# Patient Record
Sex: Male | Born: 1955 | Race: Black or African American | Hispanic: No | Marital: Married | Smoking: Never smoker
Health system: Southern US, Community
[De-identification: ages and names within clinical notes are randomized; demographics above are authoritative.]

## PROBLEM LIST (undated history)

## (undated) DIAGNOSIS — R739 Hyperglycemia, unspecified: Secondary | ICD-10-CM

## (undated) HISTORY — DX: Hyperglycemia, unspecified: R73.9

## (undated) HISTORY — PX: COLONOSCOPY: SHX174

---

## 2003-07-18 ENCOUNTER — Encounter: Payer: Self-pay | Admitting: Family Medicine

## 2003-07-18 ENCOUNTER — Ambulatory Visit (HOSPITAL_COMMUNITY): Admission: RE | Admit: 2003-07-18 | Discharge: 2003-07-18 | Payer: Self-pay | Admitting: Family Medicine

## 2005-10-03 ENCOUNTER — Ambulatory Visit (HOSPITAL_COMMUNITY): Admission: RE | Admit: 2005-10-03 | Discharge: 2005-10-03 | Payer: Self-pay | Admitting: Family Medicine

## 2010-01-12 ENCOUNTER — Ambulatory Visit (HOSPITAL_COMMUNITY): Admission: RE | Admit: 2010-01-12 | Discharge: 2010-01-12 | Payer: Self-pay | Admitting: Family Medicine

## 2010-10-15 ENCOUNTER — Encounter: Payer: Self-pay | Admitting: Internal Medicine

## 2010-10-22 ENCOUNTER — Ambulatory Visit: Payer: Self-pay | Admitting: Internal Medicine

## 2010-10-22 ENCOUNTER — Ambulatory Visit (HOSPITAL_COMMUNITY)
Admission: RE | Admit: 2010-10-22 | Discharge: 2010-10-22 | Payer: Self-pay | Source: Home / Self Care | Admitting: Internal Medicine

## 2010-10-28 ENCOUNTER — Encounter: Payer: Self-pay | Admitting: Internal Medicine

## 2011-01-01 NOTE — Letter (Signed)
Summary: Nathan Reynolds,Nathan Reynolds  Nathan Reynolds,Nathan Reynolds   Imported By: Rexene Alberts 10/15/2010 16:15:48  _____________________________________________________________________  External Attachment:    Type:   Image     Comment:   External Document

## 2011-01-01 NOTE — Letter (Signed)
Summary: Patient Notice, Colon Biopsy Results  [facility]  [address]   [place], [place] [address]   Phone: [phone]  Fax: [phone]       October 28, 2010   ATWOOD ADCOCK [address] [place], [place]  [address] 01/08/1956    Dear Mr. Forsberg,  I am pleased to inform you that the biopsies taken during your recent colonoscopy did not show any evidence of cancer upon pathologic examination.  Additional information/recommendations:.  You should have a repeat colonoscopy examination  in 7 years.  Please call us if you are having persistent problems or have questions about your condition that have not been fully answered at this time.  Sincerely,    R. Roetta Sessions MD, FACP [facility] Ph: [phone]    Fax: [phone]   Appended Document: Patient Notice, Colon Biopsy Results Letter mailed to pt.  Appended Document: Patient Notice, Colon Biopsy Results reminder in computer

## 2011-06-18 NOTE — Op Note (Signed)
[name], [name]                ACCOUNT NO.:  [id number]  MEDICAL RECORD NO.:  [id number]  LOCATION:  DAY                           FACILITY:  [facility]  PHYSICIAN:  R. Roetta Sessions, MD FACP FACGDATE OF BIRTH:  Apr 21, 1956  DATE OF PROCEDURE:  10/22/2010 DATE OF DISCHARGE:  10/22/2010                              OPERATIVE REPORT   INDICATIONS FOR PROCEDURE:  A 55 year old gentleman with no lower GI tract symptoms, sent over at the courtesy Dr. Lilyan Punt, for first ever screening colonoscopy.  No family history of polyps or colon cancer. Colonoscopy is now being done as screening maneuver.  Risks, benefits, limitations, alternatives, imponderables have been reviewed, questions answered, all parties agreeable to proceed.  Please see documentation in the medical record.  PROCEDURE NOTE:  O2 saturation, blood pressure, pulse, respirations were monitored throughout the entire procedure.  CONSCIOUS SEDATION:  Versed 5 mg IV, Demerol 100 mg IV in divided doses.  INSTRUMENT:  Pentax video chip system.  FINDINGS:  Digital rectal exam revealed no abnormalities.  Endoscopic findings:  Prep was adequate.  Colon:  Colonic mucosa was surveyed from the rectosigmoid junction through the left transverse right colon to the appendiceal orifice, ileocecal valve/cecum.  These structures were well seen and photographed for the record.  From this level, the scope was slowly and cautiously withdrawn.  All previously mentioned mucosal surfaces were again seen.  On the withdrawal, there was a greasy material in the colonic effluent which obscured the lens and the visual field.  The time I got to the left colon, it was pretty bad and could not wash it off, and had the patient to withdrawal of the scope.  I cleaned the scope off and advanced to the level where I left off.  I did take a mucosal biopsy, not for the pathologist, but for a landmark as to where I pulled back at.  I went up to this level and  continued to pull back, carefully examining all the mucosal surfaces.  At the splenic flexure, there was a single diminutive polyp which was cold biopsied/removed in the mid sigmoid and there was another sigmoid polyp which was cold biopsied/ removed.  Remainder of colonic mucosa appeared normal.  Scope was pulled down into the rectum where thorough examination of the rectal mucosa including retroflex view of the anal verge demonstrated no abnormalities.  The patient tolerated the procedure well.  Cecal withdrawal time 15 minutes.  IMPRESSION:  Polyps at the splenic flexure and sigmoid segment status post cold biopsy removal.  Remainder of colonic mucosa and rectum appeared normal.  RECOMMENDATIONS: 1. Polyp literature provided to [name]. 2. Followup on path. 3. Further recommendations to follow soon.     Nathan Bellows, MD FACP [facility]     RMR/MEDQ  D:  10/22/2010  T:  10/23/2010  Job:  [id number]  cc:   Lorin Picket A. Gerda Diss, MD Fax: [phone]

## 2013-02-01 ENCOUNTER — Encounter: Payer: Self-pay | Admitting: *Deleted

## 2013-02-02 ENCOUNTER — Encounter: Payer: Self-pay | Admitting: *Deleted

## 2013-07-23 ENCOUNTER — Telehealth: Payer: Self-pay | Admitting: Family Medicine

## 2013-07-23 NOTE — Telephone Encounter (Signed)
Patient needs bloodwork papers including testosterone and please mail

## 2013-07-24 ENCOUNTER — Other Ambulatory Visit: Payer: Self-pay | Admitting: *Deleted

## 2013-07-26 NOTE — Telephone Encounter (Signed)
Note was on chart this am

## 2013-07-27 ENCOUNTER — Other Ambulatory Visit: Payer: Self-pay | Admitting: *Deleted

## 2013-07-27 DIAGNOSIS — F528 Other sexual dysfunction not due to a substance or known physiological condition: Secondary | ICD-10-CM

## 2013-07-27 DIAGNOSIS — Z0000 Encounter for general adult medical examination without abnormal findings: Secondary | ICD-10-CM

## 2013-07-27 DIAGNOSIS — Z125 Encounter for screening for malignant neoplasm of prostate: Secondary | ICD-10-CM

## 2013-07-27 DIAGNOSIS — R5381 Other malaise: Secondary | ICD-10-CM

## 2013-07-27 NOTE — Telephone Encounter (Signed)
Cbc, bmet, liver, lipid, psa, testosterone. bw papers mailed to pt.

## 2013-09-30 ENCOUNTER — Encounter: Payer: Self-pay | Admitting: Family Medicine

## 2013-10-12 LAB — CBC WITH DIFFERENTIAL/PLATELET
Basophils Absolute: 0 10*3/uL (ref 0.0–0.1)
Basophils Relative: 1 % (ref 0–1)
Eosinophils Absolute: 0.1 10*3/uL (ref 0.0–0.7)
Eosinophils Relative: 3 % (ref 0–5)
HCT: 39.4 % (ref 39.0–52.0)
Hemoglobin: 13.6 g/dL (ref 13.0–17.0)
Lymphocytes Relative: 40 % (ref 12–46)
Lymphs Abs: 1.8 10*3/uL (ref 0.7–4.0)
MCH: 30.7 pg (ref 26.0–34.0)
MCHC: 34.5 g/dL (ref 30.0–36.0)
MCV: 88.9 fL (ref 78.0–100.0)
Monocytes Absolute: 0.3 10*3/uL (ref 0.1–1.0)
Monocytes Relative: 7 % (ref 3–12)
Neutro Abs: 2.2 10*3/uL (ref 1.7–7.7)
Neutrophils Relative %: 49 % (ref 43–77)
Platelets: 156 10*3/uL (ref 150–400)
RBC: 4.43 MIL/uL (ref 4.22–5.81)
RDW: 12.9 % (ref 11.5–15.5)
WBC: 4.5 10*3/uL (ref 4.0–10.5)

## 2013-10-12 LAB — HEPATIC FUNCTION PANEL
ALT: 20 U/L (ref 0–53)
AST: 19 U/L (ref 0–37)
Albumin: 4 g/dL (ref 3.5–5.2)
Alkaline Phosphatase: 61 U/L (ref 39–117)
Bilirubin, Direct: 0.1 mg/dL (ref 0.0–0.3)
Indirect Bilirubin: 0.3 mg/dL (ref 0.0–0.9)
Total Bilirubin: 0.4 mg/dL (ref 0.3–1.2)
Total Protein: 6.5 g/dL (ref 6.0–8.3)

## 2013-10-12 LAB — LIPID PANEL
Cholesterol: 140 mg/dL (ref 0–200)
HDL: 53 mg/dL (ref >39)
LDL Cholesterol: 74 mg/dL (ref 0–99)
Total CHOL/HDL Ratio: 2.6 Ratio
Triglycerides: 63 mg/dL (ref <150)
VLDL: 13 mg/dL (ref 0–40)

## 2013-10-12 LAB — TESTOSTERONE: Testosterone: 259 ng/dL — ABNORMAL LOW (ref 300–890)

## 2013-10-12 LAB — BASIC METABOLIC PANEL
BUN: 12 mg/dL (ref 6–23)
CO2: 27 mEq/L (ref 19–32)
Calcium: 9 mg/dL (ref 8.4–10.5)
Chloride: 104 mEq/L (ref 96–112)
Creat: 0.85 mg/dL (ref 0.50–1.35)
Glucose, Bld: 80 mg/dL (ref 70–99)
Potassium: 3.9 mEq/L (ref 3.5–5.3)
Sodium: 138 mEq/L (ref 135–145)

## 2013-10-12 LAB — PSA: PSA: 1.12 ng/mL (ref <=4.00)

## 2013-10-15 ENCOUNTER — Ambulatory Visit (INDEPENDENT_AMBULATORY_CARE_PROVIDER_SITE_OTHER): Payer: Managed Care, Other (non HMO) | Admitting: Family Medicine

## 2013-10-15 ENCOUNTER — Encounter: Payer: Self-pay | Admitting: Family Medicine

## 2013-10-15 VITALS — BP 110/70 | Ht 67.5 in | Wt 176.4 lb

## 2013-10-15 DIAGNOSIS — Z0000 Encounter for general adult medical examination without abnormal findings: Secondary | ICD-10-CM

## 2013-10-15 NOTE — Progress Notes (Signed)
  Subjective:    Patient ID: Nathan Reynolds, male    DOB: May 10, 1956, 57 y.o.   MRN: [id number]  HPI Patient is here today for his annual wellness exam. Patient states he has no concerns at this time. Doing very well.  The patient comes in today for a wellness visit.  A review of their health history was completed.  A review of medications was also completed. Any necessary refills were discussed. Sensible healthy diet was discussed. Importance of minimizing excessive salt and carbohydrates was also discussed. Safety was stressed including driving, activities at work and at home where applicable. Importance of regular physical activity for overall health was discussed. Preventative measures appropriate for age were discussed. Time was spent with the patient discussing any concerns they have about their well-being.   Review of Systems  Constitutional: Negative for fever, activity change and appetite change.  HENT: Negative for congestion and rhinorrhea.   Eyes: Negative for discharge.  Respiratory: Negative for cough and wheezing.   Cardiovascular: Negative for chest pain.  Gastrointestinal: Negative for vomiting, abdominal pain and blood in stool.  Genitourinary: Negative for frequency and difficulty urinating.  Musculoskeletal: Negative for neck pain.  Skin: Negative for rash.  Allergic/Immunologic: Negative for environmental allergies and food allergies.  Neurological: Negative for weakness and headaches.  Psychiatric/Behavioral: Negative for agitation.       Objective:   Physical Exam  Constitutional: He appears well-developed and well-nourished.  HENT:  Head: Normocephalic and atraumatic.  Right Ear: External ear normal.  Left Ear: External ear normal.  Nose: Nose normal.  Mouth/Throat: Oropharynx is clear and moist.  Eyes: EOM are normal. Pupils are equal, round, and reactive to light.  Neck: Normal range of motion. Neck supple. No thyromegaly present.  Cardiovascular:  Normal rate, regular rhythm and normal heart sounds.   No murmur heard. Pulmonary/Chest: Effort normal and breath sounds normal. No respiratory distress. He has no wheezes.  Abdominal: Soft. Bowel sounds are normal. He exhibits no distension and no mass. There is no tenderness.  Genitourinary: Penis normal.  Musculoskeletal: Normal range of motion. He exhibits no edema.  Lymphadenopathy:    He has no cervical adenopathy.  Neurological: He is alert. He exhibits normal muscle tone.  Skin: Skin is warm and dry. No erythema.  Psychiatric: He has a normal mood and affect. His behavior is normal. Judgment normal.          Assessment & Plan:  Wellness discussed. Lab work overall looks very good. He needs to stay healthy and try to lose about 7-10 pounds exercise watching diet. No colonoscopy necessary today prostate exam was normal. Lab work looked good.

## 2013-11-05 ENCOUNTER — Encounter: Payer: Self-pay | Admitting: Family Medicine

## 2014-10-28 ENCOUNTER — Telehealth: Payer: Self-pay | Admitting: *Deleted

## 2014-10-28 DIAGNOSIS — R5383 Other fatigue: Secondary | ICD-10-CM

## 2014-10-28 DIAGNOSIS — Z139 Encounter for screening, unspecified: Secondary | ICD-10-CM

## 2014-10-28 DIAGNOSIS — Z125 Encounter for screening for malignant neoplasm of prostate: Secondary | ICD-10-CM

## 2014-10-28 DIAGNOSIS — Z13220 Encounter for screening for lipoid disorders: Secondary | ICD-10-CM

## 2014-10-28 NOTE — Telephone Encounter (Signed)
Requesting bloodwork for appt dec 4th for physical. Last bloodwork 10/11/13. Lipid, cbc,bmp, liver, psa, testosterone

## 2014-10-28 NOTE — Telephone Encounter (Signed)
May order the above labs

## 2014-10-28 NOTE — Telephone Encounter (Signed)
Orders ready. Pt notified on voicemail.  

## 2014-11-02 LAB — CBC WITH DIFFERENTIAL/PLATELET
Basophils Absolute: 0 10*3/uL (ref 0.0–0.1)
Basophils Relative: 1 % (ref 0–1)
EOS PCT: 2 % (ref 0–5)
Eosinophils Absolute: 0.1 10*3/uL (ref 0.0–0.7)
HCT: 40.5 % (ref 39.0–52.0)
Hemoglobin: 13.9 g/dL (ref 13.0–17.0)
Lymphocytes Relative: 40 % (ref 12–46)
Lymphs Abs: 1.9 10*3/uL (ref 0.7–4.0)
MCH: 30.5 pg (ref 26.0–34.0)
MCHC: 34.3 g/dL (ref 30.0–36.0)
MCV: 88.8 fL (ref 78.0–100.0)
MONOS PCT: 7 % (ref 3–12)
MPV: 10.4 fL (ref 9.4–12.4)
Monocytes Absolute: 0.3 10*3/uL (ref 0.1–1.0)
Neutro Abs: 2.4 10*3/uL (ref 1.7–7.7)
Neutrophils Relative %: 50 % (ref 43–77)
Platelets: 170 10*3/uL (ref 150–400)
RBC: 4.56 MIL/uL (ref 4.22–5.81)
RDW: 12.4 % (ref 11.5–15.5)
WBC: 4.7 10*3/uL (ref 4.0–10.5)

## 2014-11-02 LAB — BASIC METABOLIC PANEL
BUN: 17 mg/dL (ref 6–23)
CO2: 26 mEq/L (ref 19–32)
CREATININE: 1 mg/dL (ref 0.50–1.35)
Calcium: 9.7 mg/dL (ref 8.4–10.5)
Chloride: 106 mEq/L (ref 96–112)
Glucose, Bld: 81 mg/dL (ref 70–99)
Potassium: 4.3 mEq/L (ref 3.5–5.3)
Sodium: 140 mEq/L (ref 135–145)

## 2014-11-02 LAB — LIPID PANEL
Cholesterol: 184 mg/dL (ref 0–200)
HDL: 56 mg/dL (ref >39)
LDL Cholesterol: 119 mg/dL — ABNORMAL HIGH (ref 0–99)
Total CHOL/HDL Ratio: 3.3 Ratio
Triglycerides: 46 mg/dL (ref <150)
VLDL: 9 mg/dL (ref 0–40)

## 2014-11-02 LAB — HEPATIC FUNCTION PANEL
ALT: 24 U/L (ref 0–53)
AST: 22 U/L (ref 0–37)
Albumin: 4.3 g/dL (ref 3.5–5.2)
Alkaline Phosphatase: 69 U/L (ref 39–117)
Bilirubin, Direct: 0.1 mg/dL (ref 0.0–0.3)
Indirect Bilirubin: 0.6 mg/dL (ref 0.2–1.2)
TOTAL PROTEIN: 7.1 g/dL (ref 6.0–8.3)
Total Bilirubin: 0.7 mg/dL (ref 0.2–1.2)

## 2014-11-02 LAB — PSA: PSA: 1.47 ng/mL (ref <=4.00)

## 2014-11-02 LAB — TESTOSTERONE: Testosterone: 248 ng/dL — ABNORMAL LOW (ref 300–890)

## 2014-11-04 ENCOUNTER — Encounter: Payer: Self-pay | Admitting: Family Medicine

## 2014-11-04 ENCOUNTER — Ambulatory Visit (INDEPENDENT_AMBULATORY_CARE_PROVIDER_SITE_OTHER): Payer: Managed Care, Other (non HMO) | Admitting: Family Medicine

## 2014-11-04 VITALS — Ht 68.0 in | Wt 180.0 lb

## 2014-11-04 DIAGNOSIS — Z0000 Encounter for general adult medical examination without abnormal findings: Secondary | ICD-10-CM

## 2014-11-04 MED ORDER — IBUPROFEN 800 MG PO TABS: 800.0000 mg | ORAL_TABLET | Freq: Three times a day (TID) | ORAL | Status: DC | PRN

## 2014-11-04 NOTE — Progress Notes (Signed)
   Subjective:    Patient ID: Nathan Reynolds, male    DOB: Sep 22, 1956, 58 y.o.   MRN: [id number]  HPI The patient comes in today for a wellness visit.    A review of their health history was completed.  A review of medications was also completed.  Any needed refills; none  Eating habits: health conscious  Falls/  MVA accidents in past few months: none Regular exercise: none  Specialist pt sees on regular basis: none  Preventative health issues were discussed.   Additional concerns:  Right side neck pain.   Declines flu vaccine.   Review of Systems  Constitutional: Negative for fever, activity change and appetite change.  HENT: Negative for congestion and rhinorrhea.   Eyes: Negative for discharge.  Respiratory: Negative for cough and wheezing.   Cardiovascular: Negative for chest pain.  Gastrointestinal: Negative for vomiting, abdominal pain and blood in stool.  Genitourinary: Negative for frequency and difficulty urinating.  Musculoskeletal: Negative for neck pain.  Skin: Negative for rash.  Allergic/Immunologic: Negative for environmental allergies and food allergies.  Neurological: Negative for weakness and headaches.  Psychiatric/Behavioral: Negative for agitation.       Objective:   Physical Exam  Constitutional: He appears well-developed and well-nourished.  HENT:  Head: Normocephalic and atraumatic.  Right Ear: External ear normal.  Left Ear: External ear normal.  Nose: Nose normal.  Mouth/Throat: Oropharynx is clear and moist.  Eyes: EOM are normal. Pupils are equal, round, and reactive to light.  Neck: Normal range of motion. Neck supple. No thyromegaly present.  Cardiovascular: Normal rate, regular rhythm and normal heart sounds.   No murmur heard. Pulmonary/Chest: Effort normal and breath sounds normal. No respiratory distress. He has no wheezes.  Abdominal: Soft. Bowel sounds are normal. He exhibits no distension and no mass. There is no tenderness.    Genitourinary: Rectum normal, prostate normal and penis normal.  Musculoskeletal: Normal range of motion. He exhibits no edema.  Lymphadenopathy:    He has no cervical adenopathy.  Neurological: He is alert. He exhibits normal muscle tone.  Skin: Skin is warm and dry. No erythema.  Psychiatric: He has a normal mood and affect. His behavior is normal. Judgment normal.          Assessment & Plan:  Wellness covered motrinm for joints prn utd colon Recheck 1 year

## 2015-10-30 ENCOUNTER — Telehealth: Payer: Self-pay | Admitting: Family Medicine

## 2015-10-30 DIAGNOSIS — R5383 Other fatigue: Secondary | ICD-10-CM

## 2015-10-30 DIAGNOSIS — Z125 Encounter for screening for malignant neoplasm of prostate: Secondary | ICD-10-CM

## 2015-10-30 DIAGNOSIS — Z13220 Encounter for screening for lipoid disorders: Secondary | ICD-10-CM

## 2015-10-30 NOTE — Telephone Encounter (Signed)
Patient has wellness coming up on 12/9 and needing labs done.

## 2015-10-30 NOTE — Telephone Encounter (Signed)
May repeat the same tests

## 2015-10-30 NOTE — Telephone Encounter (Signed)
bw orders put in. Pt notified on vm.

## 2015-10-30 NOTE — Telephone Encounter (Signed)
11-01-14 lipid, liver, bmp, cbc, psa and testosterone

## 2015-11-04 LAB — CBC WITH DIFFERENTIAL/PLATELET
Basophils Absolute: 0 10*3/uL (ref 0.0–0.2)
Basos: 0 %
EOS (ABSOLUTE): 0.1 10*3/uL (ref 0.0–0.4)
Eos: 1 %
Hematocrit: 40.1 % (ref 37.5–51.0)
Hemoglobin: 13.7 g/dL (ref 12.6–17.7)
Immature Grans (Abs): 0 10*3/uL (ref 0.0–0.1)
Immature Granulocytes: 0 %
Lymphocytes Absolute: 2.1 10*3/uL (ref 0.7–3.1)
Lymphs: 42 %
MCH: 31.3 pg (ref 26.6–33.0)
MCHC: 34.2 g/dL (ref 31.5–35.7)
MCV: 92 fL (ref 79–97)
MONOS ABS: 0.3 10*3/uL (ref 0.1–0.9)
Monocytes: 6 %
Neutrophils Absolute: 2.4 10*3/uL (ref 1.4–7.0)
Neutrophils: 51 %
PLATELETS: 158 10*3/uL (ref 150–379)
RBC: 4.38 x10E6/uL (ref 4.14–5.80)
RDW: 12.5 % (ref 12.3–15.4)
WBC: 4.8 10*3/uL (ref 3.4–10.8)

## 2015-11-04 LAB — BASIC METABOLIC PANEL
BUN/Creatinine Ratio: 16 (ref 9–20)
BUN: 15 mg/dL (ref 6–24)
CO2: 20 mmol/L (ref 18–29)
Calcium: 9.2 mg/dL (ref 8.7–10.2)
Chloride: 104 mmol/L (ref 97–106)
Creatinine, Ser: 0.93 mg/dL (ref 0.76–1.27)
GFR calc non Af Amer: 90 mL/min/{1.73_m2} (ref >59)
GFR, EST AFRICAN AMERICAN: 104 mL/min/{1.73_m2} (ref >59)
GLUCOSE: 86 mg/dL (ref 65–99)
POTASSIUM: 4.1 mmol/L (ref 3.5–5.2)
Sodium: 144 mmol/L (ref 136–144)

## 2015-11-04 LAB — HEPATIC FUNCTION PANEL
ALT: 24 IU/L (ref 0–44)
AST: 22 IU/L (ref 0–40)
Albumin: 4.4 g/dL (ref 3.5–5.5)
Alkaline Phosphatase: 83 IU/L (ref 39–117)
Bilirubin Total: 0.6 mg/dL (ref 0.0–1.2)
Bilirubin, Direct: 0.18 mg/dL (ref 0.00–0.40)
Total Protein: 7 g/dL (ref 6.0–8.5)

## 2015-11-04 LAB — TESTOSTERONE: Testosterone: 225 ng/dL — ABNORMAL LOW (ref 348–1197)

## 2015-11-04 LAB — LIPID PANEL
CHOL/HDL RATIO: 2.8 ratio (ref 0.0–5.0)
Cholesterol, Total: 176 mg/dL (ref 100–199)
HDL: 63 mg/dL (ref >39)
LDL Calculated: 103 mg/dL — ABNORMAL HIGH (ref 0–99)
Triglycerides: 48 mg/dL (ref 0–149)
VLDL Cholesterol Cal: 10 mg/dL (ref 5–40)

## 2015-11-04 LAB — PSA: PROSTATE SPECIFIC AG, SERUM: 1.3 ng/mL (ref 0.0–4.0)

## 2015-11-10 ENCOUNTER — Encounter: Payer: Self-pay | Admitting: Family Medicine

## 2015-11-10 ENCOUNTER — Ambulatory Visit (INDEPENDENT_AMBULATORY_CARE_PROVIDER_SITE_OTHER): Payer: BLUE CROSS/BLUE SHIELD | Admitting: Family Medicine

## 2015-11-10 VITALS — BP 118/70 | Ht 67.5 in | Wt 185.0 lb

## 2015-11-10 DIAGNOSIS — Z0000 Encounter for general adult medical examination without abnormal findings: Secondary | ICD-10-CM | POA: Diagnosis not present

## 2015-11-10 NOTE — Progress Notes (Signed)
   Subjective:    Patient ID: Nathan Reynolds, male    DOB: 1956/01/22, 59 y.o.   MRN: [id number]  HPI The patient comes in today for a wellness visit.    A review of their health history was completed.  A review of medications was also completed.  Any needed refills; none  Eating habits: health conscious  Falls/  MVA accidents in past few months: none  Regular exercise: yes walking, treadmill  Specialist pt sees on regular basis: none  Preventative health issues were discussed.   Additional concerns: none  Declines flu vaccine.     Review of Systems  Constitutional: Negative for fever, activity change and appetite change.  HENT: Negative for congestion and rhinorrhea.   Eyes: Negative for discharge.  Respiratory: Negative for cough and wheezing.   Cardiovascular: Negative for chest pain.  Gastrointestinal: Negative for vomiting, abdominal pain and blood in stool.  Genitourinary: Negative for frequency and difficulty urinating.  Musculoskeletal: Negative for neck pain.  Skin: Negative for rash.  Allergic/Immunologic: Negative for environmental allergies and food allergies.  Neurological: Negative for weakness and headaches.  Psychiatric/Behavioral: Negative for agitation.       Objective:   Physical Exam  Constitutional: He appears well-developed and well-nourished.  HENT:  Head: Normocephalic and atraumatic.  Right Ear: External ear normal.  Left Ear: External ear normal.  Nose: Nose normal.  Mouth/Throat: Oropharynx is clear and moist.  Eyes: EOM are normal. Pupils are equal, round, and reactive to light.  Neck: Normal range of motion. Neck supple. No thyromegaly present.  Cardiovascular: Normal rate, regular rhythm and normal heart sounds.   No murmur heard. Pulmonary/Chest: Effort normal and breath sounds normal. No respiratory distress. He has no wheezes.  Abdominal: Soft. Bowel sounds are normal. He exhibits no distension and no mass. There is no  tenderness.  Genitourinary: Prostate normal and penis normal.  Musculoskeletal: Normal range of motion. He exhibits no edema.  Lymphadenopathy:    He has no cervical adenopathy.  Neurological: He is alert. He exhibits normal muscle tone.  Skin: Skin is warm and dry. No erythema.  Psychiatric: He has a normal mood and affect. His behavior is normal. Judgment normal.          Assessment & Plan:  Safety dietary measures discussed in detail. Patient overall doing well. 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 May need to have a repeat colonoscopy he states he was told he does not need a repeat colonoscopy until 2018 I suspect he'll probably need to be in 2016  Patient was encouraged watch diet closely try to lose some weight He is going to excise more often Safety dietary good. Recent lost of his of his stepfather would set him but patient not depressed. Follow-up again in one years time

## 2015-11-17 ENCOUNTER — Other Ambulatory Visit: Payer: Self-pay | Admitting: Family Medicine

## 2015-11-20 ENCOUNTER — Other Ambulatory Visit: Payer: Self-pay | Admitting: Family Medicine

## 2016-02-09 ENCOUNTER — Telehealth: Payer: Self-pay | Admitting: Family Medicine

## 2016-02-09 ENCOUNTER — Other Ambulatory Visit: Payer: Self-pay | Admitting: Family Medicine

## 2016-02-09 MED ORDER — IBUPROFEN 800 MG PO TABS: ORAL_TABLET | ORAL | Status: DC

## 2016-02-09 NOTE — Telephone Encounter (Signed)
Ibuprofen rf sent in Fri pm ,plz notify pt

## 2016-02-09 NOTE — Telephone Encounter (Signed)
Patient would like refill on Ibuprofen 800 called into [place].

## 2016-02-12 NOTE — Telephone Encounter (Signed)
Patient notified

## 2016-10-01 ENCOUNTER — Telehealth: Payer: Self-pay | Admitting: Family Medicine

## 2016-10-01 DIAGNOSIS — Z0000 Encounter for general adult medical examination without abnormal findings: Secondary | ICD-10-CM

## 2016-10-01 DIAGNOSIS — Z125 Encounter for screening for malignant neoplasm of prostate: Secondary | ICD-10-CM

## 2016-10-01 NOTE — Telephone Encounter (Signed)
Lipid, liver, metabolic 7, CBC, PSA °

## 2016-10-01 NOTE — Telephone Encounter (Signed)
bloodwork orders ready. Pt notified on voicemail.  

## 2016-10-01 NOTE — Telephone Encounter (Signed)
Patient requesting lab paper for physical  12/15.

## 2016-11-15 ENCOUNTER — Encounter: Payer: BLUE CROSS/BLUE SHIELD | Admitting: Family Medicine

## 2016-11-22 DIAGNOSIS — Z0000 Encounter for general adult medical examination without abnormal findings: Secondary | ICD-10-CM | POA: Diagnosis not present

## 2016-11-22 DIAGNOSIS — Z125 Encounter for screening for malignant neoplasm of prostate: Secondary | ICD-10-CM | POA: Diagnosis not present

## 2016-11-23 LAB — LIPID PANEL
Chol/HDL Ratio: 2.9 ratio units (ref 0.0–5.0)
Cholesterol, Total: 180 mg/dL (ref 100–199)
HDL: 62 mg/dL (ref >39)
LDL Calculated: 106 mg/dL — ABNORMAL HIGH (ref 0–99)
TRIGLYCERIDES: 61 mg/dL (ref 0–149)
VLDL Cholesterol Cal: 12 mg/dL (ref 5–40)

## 2016-11-23 LAB — CBC WITH DIFFERENTIAL/PLATELET
Basophils Absolute: 0 10*3/uL (ref 0.0–0.2)
Basos: 1 %
EOS (ABSOLUTE): 0.1 10*3/uL (ref 0.0–0.4)
EOS: 3 %
Hematocrit: 43.2 % (ref 37.5–51.0)
Hemoglobin: 15 g/dL (ref 13.0–17.7)
Immature Grans (Abs): 0 10*3/uL (ref 0.0–0.1)
Immature Granulocytes: 0 %
LYMPHS: 41 %
Lymphocytes Absolute: 1.6 10*3/uL (ref 0.7–3.1)
MCH: 30.7 pg (ref 26.6–33.0)
MCHC: 34.7 g/dL (ref 31.5–35.7)
MCV: 88 fL (ref 79–97)
Monocytes Absolute: 0.3 10*3/uL (ref 0.1–0.9)
Monocytes: 9 %
Neutrophils Absolute: 1.7 10*3/uL (ref 1.4–7.0)
Neutrophils: 46 %
PLATELETS: 177 10*3/uL (ref 150–379)
RBC: 4.89 x10E6/uL (ref 4.14–5.80)
RDW: 12.8 % (ref 12.3–15.4)
WBC: 3.8 10*3/uL (ref 3.4–10.8)

## 2016-11-23 LAB — BASIC METABOLIC PANEL
BUN / CREAT RATIO: 18 (ref 10–24)
BUN: 19 mg/dL (ref 8–27)
CO2: 23 mmol/L (ref 18–29)
Calcium: 9.3 mg/dL (ref 8.6–10.2)
Chloride: 102 mmol/L (ref 96–106)
Creatinine, Ser: 1.06 mg/dL (ref 0.76–1.27)
GFR calc Af Amer: 88 mL/min/{1.73_m2} (ref >59)
GFR calc non Af Amer: 76 mL/min/{1.73_m2} (ref >59)
GLUCOSE: 96 mg/dL (ref 65–99)
Potassium: 4.9 mmol/L (ref 3.5–5.2)
Sodium: 141 mmol/L (ref 134–144)

## 2016-11-23 LAB — HEPATIC FUNCTION PANEL
ALK PHOS: 97 IU/L (ref 39–117)
ALT: 25 IU/L (ref 0–44)
AST: 21 IU/L (ref 0–40)
Albumin: 4.2 g/dL (ref 3.6–4.8)
Bilirubin Total: 0.6 mg/dL (ref 0.0–1.2)
Bilirubin, Direct: 0.1 mg/dL (ref 0.00–0.40)
Total Protein: 7 g/dL (ref 6.0–8.5)

## 2016-11-23 LAB — PSA: Prostate Specific Ag, Serum: 1.6 ng/mL (ref 0.0–4.0)

## 2016-11-29 ENCOUNTER — Encounter: Payer: Self-pay | Admitting: Family Medicine

## 2016-11-29 ENCOUNTER — Ambulatory Visit (INDEPENDENT_AMBULATORY_CARE_PROVIDER_SITE_OTHER): Payer: BLUE CROSS/BLUE SHIELD | Admitting: Family Medicine

## 2016-11-29 VITALS — BP 122/80 | Ht 68.0 in | Wt 188.6 lb

## 2016-11-29 DIAGNOSIS — Z0000 Encounter for general adult medical examination without abnormal findings: Secondary | ICD-10-CM | POA: Diagnosis not present

## 2016-11-29 DIAGNOSIS — D171 Benign lipomatous neoplasm of skin and subcutaneous tissue of trunk: Secondary | ICD-10-CM | POA: Diagnosis not present

## 2016-11-29 DIAGNOSIS — Z1211 Encounter for screening for malignant neoplasm of colon: Secondary | ICD-10-CM

## 2016-11-29 NOTE — Progress Notes (Signed)
   Subjective:    Patient ID: Lin Landsman, male    DOB: 24-Aug-1956, 60 y.o.   MRN: [id number]  HPI The patient comes in today for a wellness visit.    A review of their health history was completed.  A review of medications was also completed.  Any needed refills; none  Eating habits: eats pretty good  Falls/  MVA accidents in past few months: none  Regular exercise: walking and treadmill  Specialist pt sees on regular basis: no  Preventative health issues were discussed.   Additional concerns: none    Review of Systems  Constitutional: Negative for activity change, appetite change and fever.  HENT: Negative for congestion and rhinorrhea.   Eyes: Negative for discharge.  Respiratory: Negative for cough and wheezing.   Cardiovascular: Negative for chest pain.  Gastrointestinal: Negative for abdominal pain, blood in stool and vomiting.  Genitourinary: Negative for difficulty urinating and frequency.  Musculoskeletal: Negative for neck pain.  Skin: Negative for rash.  Allergic/Immunologic: Negative for environmental allergies and food allergies.  Neurological: Negative for weakness and headaches.  Psychiatric/Behavioral: Negative for agitation.       Objective:   Physical Exam  Constitutional: He appears well-developed and well-nourished.  HENT:  Head: Normocephalic and atraumatic.  Right Ear: External ear normal.  Left Ear: External ear normal.  Nose: Nose normal.  Mouth/Throat: Oropharynx is clear and moist.  Eyes: EOM are normal. Pupils are equal, round, and reactive to light.  Neck: Normal range of motion. Neck supple. No thyromegaly present.  Cardiovascular: Normal rate, regular rhythm and normal heart sounds.   No murmur heard. Pulmonary/Chest: Effort normal and breath sounds normal. No respiratory distress. He has no wheezes.  Abdominal: Soft. Bowel sounds are normal. He exhibits no distension and no mass. There is no tenderness.  Genitourinary: Penis  normal.  Musculoskeletal: Normal range of motion. He exhibits no edema.  Lymphadenopathy:    He has no cervical adenopathy.  Neurological: He is alert. He exhibits normal muscle tone.  Skin: Skin is warm and dry. No erythema.  Psychiatric: He has a normal mood and affect. His behavior is normal. Judgment normal.   The patient has a firm nodule on the right lower abdomen area that then the fatty tissue does not feel soft like a lipoma is fairly firm is approximately minute injury by 1 inch.  Patient's lab work overall looks good healthy diet regular physical activity recommended referral to colonoscopy recommended     Assessment & Plan:  Adult wellness-complete.wellness physical was conducted today. Importance of diet and exercise were discussed in detail. In addition to this a discussion regarding safety was also covered. We also reviewed over immunizations and gave recommendations regarding current immunization needed for age. In addition to this additional areas were also touched on including: Preventative health exams needed: Colonoscopy  Patient is due for colonoscopy bcause of tubulr adnma in 2011  Patient was advised yearly wellness exam Ultrasound of abdominal wall to see if this is a cyst versus a solid structure may end up needing MRI

## 2016-11-29 NOTE — Patient Instructions (Signed)
DASH Eating Plan DASH stands for "Dietary Approaches to Stop Hypertension." The DASH eating plan is a healthy eating plan that has been shown to reduce high blood pressure (hypertension). Additional health benefits may include reducing the risk of type 2 diabetes mellitus, heart disease, and stroke. The DASH eating plan may also help with weight loss. What do I need to know about the DASH eating plan? For the DASH eating plan, you will follow these general guidelines:  Choose foods with less than 150 milligrams of sodium per serving (as listed on the food label).  Use salt-free seasonings or herbs instead of table salt or sea salt.  Check with your health care provider or pharmacist before using salt substitutes.  Eat lower-sodium products. These are often labeled as "low-sodium" or "no salt added."  Eat fresh foods. Avoid eating a lot of canned foods.  Eat more vegetables, fruits, and low-fat dairy products.  Choose whole grains. Look for the word "whole" as the first word in the ingredient list.  Choose fish and skinless chicken or turkey more often than red meat. Limit fish, poultry, and meat to 6 oz (170 g) each day.  Limit sweets, desserts, sugars, and sugary drinks.  Choose heart-healthy fats.  Eat more home-cooked food and less restaurant, buffet, and fast food.  Limit fried foods.  Do not fry foods. Cook foods using methods such as baking, boiling, grilling, and broiling instead.  When eating at a restaurant, ask that your food be prepared with less salt, or no salt if possible. What foods can I eat? Seek help from a dietitian for individual calorie needs. Grains  Whole grain or whole wheat bread. Brown rice. Whole grain or whole wheat pasta. Quinoa, bulgur, and whole grain cereals. Low-sodium cereals. Corn or whole wheat flour tortillas. Whole grain cornbread. Whole grain crackers. Low-sodium crackers. Vegetables  Fresh or frozen vegetables (raw, steamed, roasted, or  grilled). Low-sodium or reduced-sodium tomato and vegetable juices. Low-sodium or reduced-sodium tomato sauce and paste. Low-sodium or reduced-sodium canned vegetables. Fruits  All fresh, canned (in natural juice), or frozen fruits. Meat and Other Protein Products  Ground beef (85% or leaner), grass-fed beef, or beef trimmed of fat. Skinless chicken or turkey. Ground chicken or turkey. Pork trimmed of fat. All fish and seafood. Eggs. Dried beans, peas, or lentils. Unsalted nuts and seeds. Unsalted canned beans. Dairy  Low-fat dairy products, such as skim or 1% milk, 2% or reduced-fat cheeses, low-fat ricotta or cottage cheese, or plain low-fat yogurt. Low-sodium or reduced-sodium cheeses. Fats and Oils  Tub margarines without trans fats. Light or reduced-fat mayonnaise and salad dressings (reduced sodium). Avocado. Safflower, olive, or canola oils. Natural peanut or almond butter. Other  Unsalted popcorn and pretzels. The items listed above may not be a complete list of recommended foods or beverages. Contact your dietitian for more options.  What foods are not recommended? Grains  White bread. White pasta. White rice. Refined cornbread. Bagels and croissants. Crackers that contain trans fat. Vegetables  Creamed or fried vegetables. Vegetables in a cheese sauce. Regular canned vegetables. Regular canned tomato sauce and paste. Regular tomato and vegetable juices. Fruits  Canned fruit in light or heavy syrup. Fruit juice. Meat and Other Protein Products  Fatty cuts of meat. Ribs, chicken wings, bacon, sausage, bologna, salami, chitterlings, fatback, hot dogs, bratwurst, and packaged luncheon meats. Salted nuts and seeds. Canned beans with salt. Dairy  Whole or 2% milk, cream, half-and-half, and cream cheese. Whole-fat or sweetened yogurt. Full-fat cheeses   or blue cheese. Nondairy creamers and whipped toppings. Processed cheese, cheese spreads, or cheese curds. Condiments  Onion and garlic  salt, seasoned salt, table salt, and sea salt. Canned and packaged gravies. Worcestershire sauce. Tartar sauce. Barbecue sauce. Teriyaki sauce. Soy sauce, including reduced sodium. Steak sauce. Fish sauce. Oyster sauce. Cocktail sauce. Horseradish. Ketchup and mustard. Meat flavorings and tenderizers. Bouillon cubes. Hot sauce. Tabasco sauce. Marinades. Taco seasonings. Relishes. Fats and Oils  Butter, stick margarine, lard, shortening, ghee, and bacon fat. Coconut, palm kernel, or palm oils. Regular salad dressings. Other  Pickles and olives. Salted popcorn and pretzels. The items listed above may not be a complete list of foods and beverages to avoid. Contact your dietitian for more information.  Where can I find more information? National Heart, Lung, and Blood Institute: www.nhlbi.nih.gov/health/health-topics/topics/dash/ This information is not intended to replace advice given to you by your health care provider. Make sure you discuss any questions you have with your health care provider. Document Released: 11/07/2011 Document Revised: 04/25/2016 Document Reviewed: 09/22/2013 Elsevier Interactive Patient Education  2017 [employer]vier Inc.  

## 2016-11-29 NOTE — Addendum Note (Signed)
Addended by: Carmelina Noun on: 11/29/2016 04:24 PM   Modules accepted: Orders

## 2016-12-03 ENCOUNTER — Encounter: Payer: Self-pay | Admitting: Family Medicine

## 2016-12-06 ENCOUNTER — Ambulatory Visit (HOSPITAL_COMMUNITY)
Admission: RE | Admit: 2016-12-06 | Discharge: 2016-12-06 | Disposition: A | Discharge status: Home / Self Care | Payer: BLUE CROSS/BLUE SHIELD | Source: Ambulatory Visit | Attending: Family Medicine | Admitting: Family Medicine

## 2016-12-06 DIAGNOSIS — D171 Benign lipomatous neoplasm of skin and subcutaneous tissue of trunk: Secondary | ICD-10-CM | POA: Insufficient documentation

## 2016-12-06 DIAGNOSIS — Z0389 Encounter for observation for other suspected diseases and conditions ruled out: Secondary | ICD-10-CM | POA: Diagnosis not present

## 2016-12-11 ENCOUNTER — Telehealth: Payer: Self-pay

## 2016-12-11 NOTE — Telephone Encounter (Signed)
Triaged today.  

## 2016-12-11 NOTE — Telephone Encounter (Signed)
Patient received letter to schedule tcs  °

## 2016-12-12 NOTE — Telephone Encounter (Signed)
Gastroenterology Pre-Procedure Review  Request Date: 12/11/2016 Requesting Physician: ON RECALL FOR 11/2017 Pt would like to have colonoscopy now although no problems   PATIENT REVIEW QUESTIONS: The patient responded to the following health history questions as indicated:    1. Diabetes Melitis: no 2. Joint replacements in the past 12 months: no 3. Major health problems in the past 3 months: no 4. Has an artificial valve or MVP: no 5. Has a defibrillator: no 6. Has been advised in past to take antibiotics in advance of a procedure like teeth cleaning: no 7. Family history of colon cancer: no  8. Alcohol Use: no 9. History of sleep apnea: no  10. History of coronary artery or other vascular stents placed within the last 12 months: no    MEDICATIONS & ALLERGIES:    Patient reports the following regarding taking any blood thinners:   Plavix? no Aspirin? no Coumadin? no Brilinta? no Xarelto? no Eliquis? no Pradaxa? no Savaysa? no Effient? no  Patient confirms/reports the following medications:  Current Outpatient Prescriptions  Medication Sig Dispense Refill  . BIOTIN PO Take by mouth daily.    . Cholecalciferol (VITAMIN D-3 PO) Take by mouth daily.    . Cyanocobalamin (VITAMIN B12 PO) Take by mouth daily.    . fish oil-omega-3 fatty acids 1000 MG capsule Take 2 g by mouth daily.    Marland Kitchen ibuprofen (ADVIL,MOTRIN) 800 MG tablet TAKE 1 TABLET BY MOUTH EVERY EIGHT HOURS AS NEEDED. 90 tablet 2  . Multiple Vitamin (MULTIVITAMIN) tablet Take 1 tablet by mouth daily.     No current facility-administered medications for this visit.     Patient confirms/reports the following allergies:  No Known Allergies  No orders of the defined types were placed in this encounter.   AUTHORIZATION INFORMATION Primary Insurance:   ID #:  Group #:  Pre-Cert / Auth required:  Pre-Cert / Auth #:   Secondary Insurance:   ID #:  Group #:  Pre-Cert / Auth required:  Pre-Cert / Auth #:   SCHEDULE  INFORMATION: Procedure has been scheduled as follows:  Date: 01/06/2017               Time:  11:45 AM Location: [facility] Short Stay  This Gastroenterology Pre-Precedure Review Form is being routed to the following provider(s): R. Garfield Cornea, MD

## 2016-12-12 NOTE — Telephone Encounter (Signed)
Ok to schedule.

## 2016-12-13 ENCOUNTER — Other Ambulatory Visit: Payer: Self-pay

## 2016-12-13 DIAGNOSIS — Z1211 Encounter for screening for malignant neoplasm of colon: Secondary | ICD-10-CM

## 2016-12-13 MED ORDER — NA SULFATE-K SULFATE-MG SULF 17.5-3.13-1.6 GM/177ML PO SOLN: 1.0000 | ORAL | 0 refills | Status: DC

## 2016-12-13 NOTE — Addendum Note (Signed)
Addended by: Everardo All on: 12/13/2016 11:07 AM   Modules accepted: Orders

## 2016-12-16 NOTE — Telephone Encounter (Signed)
Pt wants to cancel now and schedule on a Friday when Dr. Gala Romney is at the hospital. I have a note to call. I have taken him off of the list and [facility] for Hoyle Sauer to cancel.

## 2016-12-30 ENCOUNTER — Other Ambulatory Visit: Payer: Self-pay

## 2016-12-30 DIAGNOSIS — D369 Benign neoplasm, unspecified site: Secondary | ICD-10-CM

## 2016-12-30 NOTE — Telephone Encounter (Signed)
PT has been rescheduled to 02/14/2017 at 7:30 Am with Dr. Gala Romney. I am mailing new instructions and will update triage prior to his procedure.

## 2017-01-06 ENCOUNTER — Ambulatory Visit (HOSPITAL_COMMUNITY): Admit: 2017-01-06 | Payer: BLUE CROSS/BLUE SHIELD | Admitting: Internal Medicine

## 2017-01-06 ENCOUNTER — Encounter (HOSPITAL_COMMUNITY): Payer: Self-pay

## 2017-01-06 SURGERY — COLONOSCOPY
Anesthesia: Moderate Sedation

## 2017-01-29 NOTE — Telephone Encounter (Signed)
NO PA needed for TCS ref# Meyer Russel 01/30/16

## 2017-02-14 ENCOUNTER — Encounter (HOSPITAL_COMMUNITY)
Admission: RE | Disposition: A | Discharge status: Home / Self Care | Payer: Self-pay | Source: Ambulatory Visit | Attending: Internal Medicine

## 2017-02-14 ENCOUNTER — Encounter (HOSPITAL_COMMUNITY): Payer: Self-pay | Admitting: *Deleted

## 2017-02-14 ENCOUNTER — Ambulatory Visit (HOSPITAL_COMMUNITY)
Admission: RE | Admit: 2017-02-14 | Discharge: 2017-02-14 | Disposition: A | Discharge status: Home / Self Care | Payer: BLUE CROSS/BLUE SHIELD | Source: Ambulatory Visit | Attending: Internal Medicine | Admitting: Internal Medicine

## 2017-02-14 DIAGNOSIS — D124 Benign neoplasm of descending colon: Secondary | ICD-10-CM

## 2017-02-14 DIAGNOSIS — Z79899 Other long term (current) drug therapy: Secondary | ICD-10-CM | POA: Diagnosis not present

## 2017-02-14 DIAGNOSIS — D120 Benign neoplasm of cecum: Secondary | ICD-10-CM

## 2017-02-14 DIAGNOSIS — D125 Benign neoplasm of sigmoid colon: Secondary | ICD-10-CM

## 2017-02-14 DIAGNOSIS — Z86010 Personal history of colonic polyps: Secondary | ICD-10-CM | POA: Diagnosis not present

## 2017-02-14 DIAGNOSIS — R739 Hyperglycemia, unspecified: Secondary | ICD-10-CM | POA: Insufficient documentation

## 2017-02-14 DIAGNOSIS — D369 Benign neoplasm, unspecified site: Secondary | ICD-10-CM

## 2017-02-14 DIAGNOSIS — Z1211 Encounter for screening for malignant neoplasm of colon: Secondary | ICD-10-CM | POA: Diagnosis not present

## 2017-02-14 DIAGNOSIS — D122 Benign neoplasm of ascending colon: Secondary | ICD-10-CM

## 2017-02-14 HISTORY — PX: COLONOSCOPY: SHX5424

## 2017-02-14 HISTORY — PX: POLYPECTOMY: SHX5525

## 2017-02-14 SURGERY — COLONOSCOPY
Anesthesia: Moderate Sedation

## 2017-02-14 MED ORDER — MEPERIDINE HCL 100 MG/ML IJ SOLN
INTRAMUSCULAR | Status: DC | PRN
  Administered 2017-02-14 (×2): 25 mg via INTRAVENOUS
  Administered 2017-02-14: 50 mg via INTRAVENOUS

## 2017-02-14 MED ORDER — STERILE WATER FOR IRRIGATION IR SOLN
Status: DC | PRN
  Administered 2017-02-14: 2.5 mL

## 2017-02-14 MED ORDER — MIDAZOLAM HCL 5 MG/5ML IJ SOLN
INTRAMUSCULAR | Status: AC
  Filled 2017-02-14: qty 10

## 2017-02-14 MED ORDER — SODIUM CHLORIDE 0.9 % IV SOLN
INTRAVENOUS | Status: DC
  Administered 2017-02-14: 07:00:00 via INTRAVENOUS

## 2017-02-14 MED ORDER — ONDANSETRON HCL 4 MG/2ML IJ SOLN
INTRAMUSCULAR | Status: DC | PRN
  Administered 2017-02-14: 4 mg via INTRAVENOUS

## 2017-02-14 MED ORDER — MIDAZOLAM HCL 5 MG/5ML IJ SOLN
INTRAMUSCULAR | Status: DC | PRN
  Administered 2017-02-14 (×2): 2 mg via INTRAVENOUS
  Administered 2017-02-14: 1 mg via INTRAVENOUS

## 2017-02-14 MED ORDER — ONDANSETRON HCL 4 MG/2ML IJ SOLN
INTRAMUSCULAR | Status: AC
  Filled 2017-02-14: qty 2

## 2017-02-14 MED ORDER — MEPERIDINE HCL 100 MG/ML IJ SOLN
INTRAMUSCULAR | Status: DC
Start: 2017-02-14 — End: 2017-02-14
  Filled 2017-02-14: qty 2

## 2017-02-14 NOTE — H&P (Addendum)
@  EUMP@   Primary Care Physician:  Sallee Lange, MD Primary Gastroenterologist:  Dr. Gala Romney  Pre-Procedure History & Physical: HPI:  Nathan Reynolds is a 60 y.o. male here for surveillance colonoscopy. History of multiple colonic adenomas removed 2011. 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  Past Medical History:  Diagnosis Date  . Hyperglycemia     Past Surgical History:  Procedure Laterality Date  . COLONOSCOPY      Prior to Admission medications   Medication Sig Start Date End Date Taking? Authorizing Provider  BIOTIN PO Take 1 tablet by mouth daily.    Yes Historical Provider, MD  Cholecalciferol (VITAMIN D3) 5000 units CAPS Take 5,000 Units by mouth daily.   Yes Historical Provider, MD  fish oil-omega-3 fatty acids 1000 MG capsule Take 2 g by mouth daily.   Yes Historical Provider, MD  ibuprofen (ADVIL,MOTRIN) 800 MG tablet TAKE 1 TABLET BY MOUTH EVERY EIGHT HOURS AS NEEDED. Patient taking differently: TAKE 1 TABLET BY MOUTH EVERY EIGHT HOURS AS NEEDED FOR PAIN 02/09/16  Yes Kathyrn Drown, MD  Multiple Vitamin (MULTIVITAMIN) tablet Take 1 tablet by mouth daily.   Yes Historical Provider, MD  Na Sulfate-K Sulfate-Mg Sulf (SUPREP BOWEL PREP KIT) 17.5-3.13-1.6 GM/180ML SOLN Take 1 kit by mouth as directed. 12/13/16  Yes Daneil Dolin, MD    Allergies as of 12/30/2016  . (No Known Allergies)    Family History  Problem Relation Age of Onset  . Diabetes Father     Social History   Social History  . Marital status: Married    Spouse name: N/A  . Number of children: N/A  . Years of education: N/A   Occupational History  . Not on file.   Social History Main Topics  . Smoking status: Never Smoker  . Smokeless tobacco: Never Used  . Alcohol use No  . Drug use: No  . Sexual activity: Not on file   Other Topics Concern  . Not on file   Social History Narrative  . No narrative on file    Review of Systems: See HPI, otherwise negative ROS  Physical  Exam: BP 136/79   Pulse 82   Temp 98 F (36.7 C) (Oral)   Resp 18   SpO2 99%  General:   Alert,  Well-developed, well-nourished, pleasant and cooperative in NAD Neck:  Supple; no masses or thyromegaly. No significant cervical adenopathy. Lungs:  Clear throughout to auscultation.   No wheezes, crackles, or rhonchi. No acute distress. Heart:  Regular rate and rhythm; no murmurs, clicks, rubs,  or gallops. Abdomen: Non-distended, normal bowel sounds.  Soft and nontender without appreciable mass or hepatosplenomegaly.  Pulses:  Normal pulses noted. Extremities:  Without clubbing or edema.  Impression:  Pleasant 61 year old gentleman with history of colonic adenoma. Here for splint examination. Currently, has no bowel symptoms.  Recommendations:  Patient surrounds colonoscopy today.  The risks, benefits, limitations, alternatives and imponderables have been reviewed with the patient. Questions have been answered. All parties are agreeable.      Notice: This dictation was prepared with Dragon dictation along with smaller phrase technology. Any transcriptional errors that result from this process are unintentional and may not be corrected upon review.

## 2017-02-14 NOTE — Discharge Instructions (Addendum)
Colonoscopy Discharge Instructions  Read the instructions outlined below and refer to this sheet in the next few weeks. These discharge instructions provide you with general information on caring for yourself after you leave the hospital. Your doctor may also give you specific instructions. While your treatment has been planned according to the most current medical practices available, unavoidable complications occasionally occur. If you have any problems or questions after discharge, call Dr. Gala Romney at [phone]. ACTIVITY  You may resume your regular activity, but move at a slower pace for the next 24 hours.   Take frequent rest periods for the next 24 hours.   Walking will help get rid of the air and reduce the bloated feeling in your belly (abdomen).   No driving for 24 hours (because of the medicine (anesthesia) used during the test).    Do not sign any important legal documents or operate any machinery for 24 hours (because of the anesthesia used during the test).  NUTRITION  Drink plenty of fluids.   You may resume your normal diet as instructed by your doctor.   Begin with a light meal and progress to your normal diet. Heavy or fried foods are harder to digest and may make you feel sick to your stomach (nauseated).   Avoid alcoholic beverages for 24 hours or as instructed.  MEDICATIONS  You may resume your normal medications unless your doctor tells you otherwise.  WHAT YOU CAN EXPECT TODAY  Some feelings of bloating in the abdomen.   Passage of more gas than usual.   Spotting of blood in your stool or on the toilet paper.  IF YOU HAD POLYPS REMOVED DURING THE COLONOSCOPY:  No aspirin products for 7 days or as instructed.   No alcohol for 7 days or as instructed.   Eat a soft diet for the next 24 hours.  FINDING OUT THE RESULTS OF YOUR TEST Not all test results are available during your visit. If your test results are not back during the visit, make an appointment  with your caregiver to find out the results. Do not assume everything is normal if you have not heard from your caregiver or the medical facility. It is important for you to follow up on all of your test results.  SEEK IMMEDIATE MEDICAL ATTENTION IF:  You have more than a spotting of blood in your stool.   Your belly is swollen (abdominal distention).   You are nauseated or vomiting.   You have a temperature over 101.   You have abdominal pain or discomfort that is severe or gets worse throughout the day.     Colon Polyps Polyps are tissue growths inside the body. Polyps can grow in many places, including the large intestine (colon). A polyp may be a round bump or a mushroom-shaped growth. You could have one polyp or several. Most colon polyps are noncancerous (benign). However, some colon polyps can become cancerous over time. What are the causes? The exact cause of colon polyps is not known. What increases the risk? This condition is more likely to develop in people who:  Have a family history of colon cancer or colon polyps.  Are older than 2 or older than 45 if they are African American.  Have inflammatory bowel disease, such as ulcerative colitis or Crohn disease.  Are overweight.  Smoke cigarettes.  Do not get enough exercise.  Drink too much alcohol.  Eat a diet that is:  High in fat and red meat.  Low  in fiber.  Had childhood cancer that was treated with abdominal radiation. What are the signs or symptoms? Most polyps do not cause symptoms. If you have symptoms, they may include:  Blood coming from your rectum when having a bowel movement.  Blood in your stool.The stool may look dark red or black.  A change in bowel habits, such as constipation or diarrhea. How is this diagnosed? This condition is diagnosed with a colonoscopy. This is a procedure that uses a lighted, flexible scope to look at the inside of your colon. How is this treated? Treatment for  this condition involves removing any polyps that are found. Those polyps will then be tested for cancer. If cancer is found, your health care provider will talk to you about options for colon cancer treatment. Follow these instructions at home: Diet   Eat plenty of fiber, such as fruits, vegetables, and whole grains.  Eat foods that are high in calcium and vitamin D, such as milk, cheese, yogurt, eggs, liver, fish, and broccoli.  Limit foods high in fat, red meats, and processed meats, such as hot dogs, sausage, bacon, and lunch meats.  Maintain a healthy weight, or lose weight if recommended by your health care provider. General instructions   Do not smoke cigarettes.  Do not drink alcohol excessively.  Keep all follow-up visits as told by your health care provider. This is important. This includes keeping regularly scheduled colonoscopies. Talk to your health care provider about when you need a colonoscopy.  Exercise every day or as told by your health care provider. Contact a health care provider if:  You have new or worsening bleeding during a bowel movement.  You have new or increased blood in your stool.  You have a change in bowel habits.  You unexpectedly lose weight. This information is not intended to replace advice given to you by your health care provider. Make sure you discuss any questions you have with your health care provider. Document Released: 08/14/2004 Document Revised: 04/25/2016 Document Reviewed: 10/09/2015 Elsevier Interactive Patient Education  2017 [employer].   Colon polyp information provided  Further recommendations to follow pending review of pathology report  No MRI the future to clip no longer present

## 2017-02-14 NOTE — Op Note (Signed)
[facility] Patient Name: Nathan Reynolds Procedure Date: 02/14/2017 7:05 AM MRN: [id number] Date of Birth: June 14, 1956 Attending MD: Norvel Richards , MD CSN: [id number] Age: 61 Admit Type: Outpatient Procedure:                Colonoscopy Indications:              High risk colon cancer surveillance: Personal                            history of colonic polyps Providers:                Norvel Richards, MD, Lurline Del, RN, Aram Candela Referring MD:             Elayne Snare. Luking Medicines:                Midazolam 5 mg IV, Meperidine 100 mg IV,                            Ondansetron 4 mg IV Complications:            No immediate complications. Estimated Blood Loss:     Estimated blood loss was minimal. Procedure:                Pre-Anesthesia Assessment:                           - Prior to the procedure, a History and Physical                            was performed, and patient medications and                            allergies were reviewed. The patient's tolerance of                            previous anesthesia was also reviewed. The risks                            and benefits of the procedure and the sedation                            options and risks were discussed with the patient.                            All questions were answered, and informed consent                            was obtained. Prior Anticoagulants: The patient has                            taken no previous anticoagulant or antiplatelet  agents. ASA Grade Assessment: II - A patient with                            mild systemic disease. After reviewing the risks                            and benefits, the patient was deemed in                            satisfactory condition to undergo the procedure.                           After obtaining informed consent, the colonoscope                            was passed under direct vision.  Throughout the                            procedure, the patient's blood pressure, pulse, and                            oxygen saturations were monitored continuously. The                            EC-[id number] ([id number]) scope was introduced through                            the anus and advanced to the the cecum, identified                            by appendiceal orifice and ileocecal valve. The                            colonoscopy was performed without difficulty. The                            patient tolerated the procedure well. The quality                            of the bowel preparation was adequate. The                            ileocecal valve, appendiceal orifice, and rectum                            were photographed. Scope In: 7:48:52 AM Scope Out: 8:11:04 AM Scope Withdrawal Time: 0 hours 16 minutes 15 seconds  Total Procedure Duration: 0 hours 22 minutes 12 seconds  Findings:      The perianal and digital rectal examinations were normal.      Three semi-pedunculated polyps were found in the sigmoid colon,       ascending colon and cecum. The polyps were 4 to 8 mm in size. These       polyps were removed with a cold snare. Resection  and retrieval were       complete. Estimated blood loss was minimal. Estimated blood loss was       minimal.      A 9 mm polyp was found in the descending colon. The polyp was       pedunculated. Polyp engaged with a snare loop to remove via hot       technique. It was inadvertently guillotined. Clip placed the base the       polyp to prevent significant bleeding. sigmoid polyp cold snare was not       recovered. Estimated blood loss was minimal. Impression:               - Three 4 to 8 mm polyps in the sigmoid colon, in                            the ascending colon and in the cecum, removed with                            a cold snare. Resected and retrieved.                           - One 8 mm polyp in the descending colon, removed                             with a hot snare. Resected and retrieved. Moderate Sedation:      Moderate (conscious) sedation was administered by the endoscopy nurse       and supervised by the endoscopist. The following parameters were       monitored: oxygen saturation, heart rate, blood pressure, respiratory       rate, EKG, adequacy of pulmonary ventilation, and response to care.       Total physician intraservice time was 28 minutes. Recommendation:           - Patient has a contact number available for                            emergencies. The signs and symptoms of potential                            delayed complications were discussed with the                            patient. Return to normal activities tomorrow.                            Written discharge instructions were provided to the                            patient.                           - Resume previous diet.                           - Continue present medications.                           -  Repeat colonoscopy date to be determined after                            pending pathology results are reviewed for                            surveillance based on pathology results.                           - Return to GI clinic (date not yet determined). no                            MRI until clip gone. Procedure Code(s):        --- Professional ---                           [phone], Colonoscopy, flexible; with removal of                            tumor(s), polyp(s), or other lesion(s) by snare                            technique                           99152, Moderate sedation services provided by the                            same physician or other qualified health care                            professional performing the diagnostic or                            therapeutic service that the sedation supports,                            requiring the presence of an independent trained                             observer to assist in the monitoring of the                            patient's level of consciousness and physiological                            status; initial 15 minutes of intraservice time,                            patient age 61 years or older                           [phone], Moderate sedation services; each additional  15 minutes intraservice time Diagnosis Code(s):        --- Professional ---                           Z86.010, Personal history of colonic polyps                           D12.5, Benign neoplasm of sigmoid colon                           D12.2, Benign neoplasm of ascending colon                           D12.0, Benign neoplasm of cecum                           D12.4, Benign neoplasm of descending colon CPT copyright 2016 American Medical Association. All rights reserved. The codes documented in this report are preliminary and upon coder review may  be revised to meet current compliance requirements. Cristopher Estimable. Kaylea Mounsey, MD Norvel Richards, MD 02/14/2017 8:26:04 AM This report has been signed electronically. Number of Addenda: 0

## 2017-02-17 ENCOUNTER — Encounter (HOSPITAL_COMMUNITY): Payer: Self-pay | Admitting: Internal Medicine

## 2017-02-18 ENCOUNTER — Encounter: Payer: Self-pay | Admitting: Internal Medicine

## 2017-03-11 ENCOUNTER — Telehealth: Payer: Self-pay | Admitting: General Practice

## 2017-03-11 NOTE — Telephone Encounter (Signed)
I tried to call the patient to let him know I've reached out to another department in regards to his claim, however I was unable to reach him, so I lmom

## 2017-03-11 NOTE — Telephone Encounter (Signed)
I received a call from the patient and he stated that he spoke with a representative from [facility] and they said we need to change the code for his tcs.  I explained to the patient that he had a tubular adenoma in 2011 which warranted him to have a surveillance tcs in 7 years.  I told the patient that I would be happy to speak with a [employer] representative on his behalf in regards to this concern.  I spoke with Pamala Hurry Lewis(certified code) and made sure that the claim was coded correctly.   Patient stated he needed to go and disconnected the call.

## 2017-03-11 NOTE — Telephone Encounter (Signed)
I told the patient's wife that we are investigating his claim and I will call him with an update soon.

## 2017-03-18 NOTE — Telephone Encounter (Signed)
I spoke with the patient and he stated he would call his insurance company and have them give [place] something in writing stating his procedure should be coded as screening.

## 2017-03-25 NOTE — Telephone Encounter (Signed)
I made the patient aware of our findings and he thanked me and disconnected the call.

## 2017-05-28 IMAGING — US US ABDOMEN LIMITED
1 series · 14 of 25 positions shown · non-contrast
Comparison: None.

CLINICAL DATA: Possible lipoma abdominal wall right paraumbilical

EXAM:
US ABDOMEN LIMITED - RIGHT UPPER QUADRANT abdominal wall

[Series 1: us abdomen limited · 0.08mm/px · 14 of 32 slices shown]
[im 1/32]
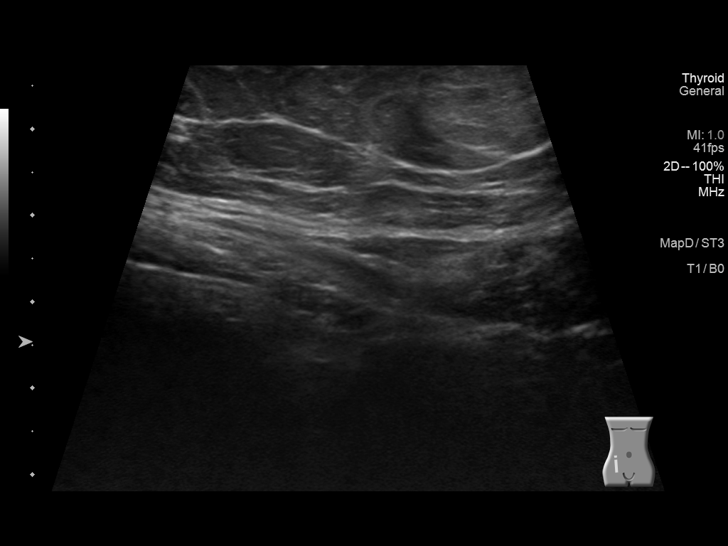
[im 3/32]
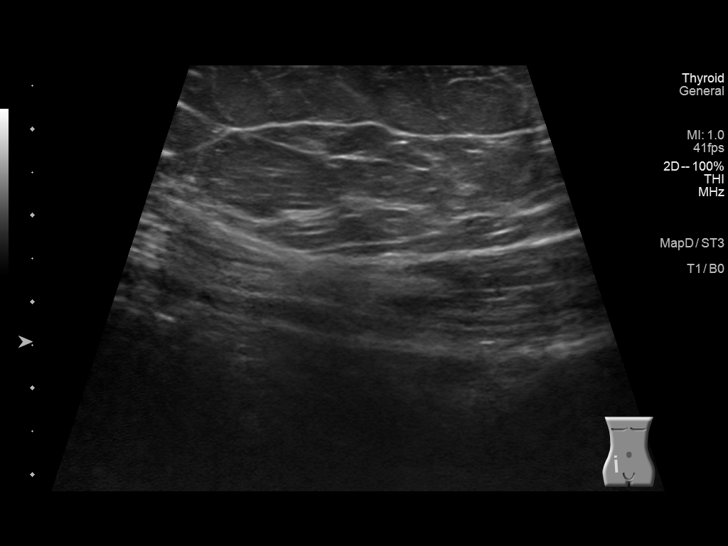
[im 6/32]
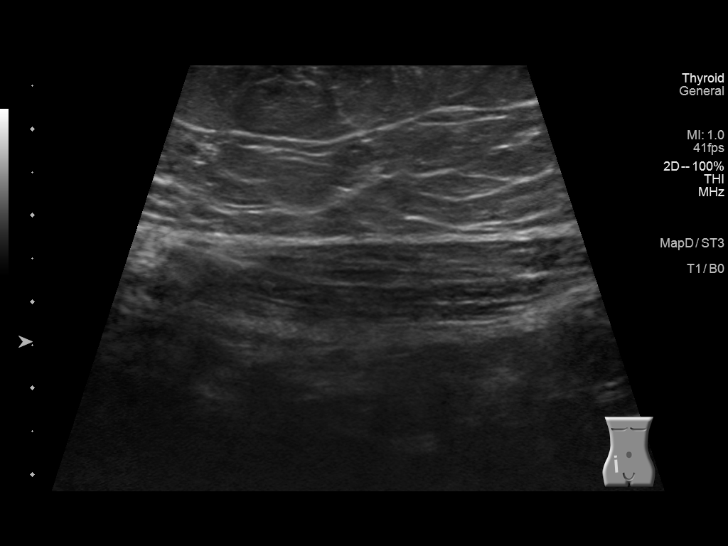
[im 8/32]
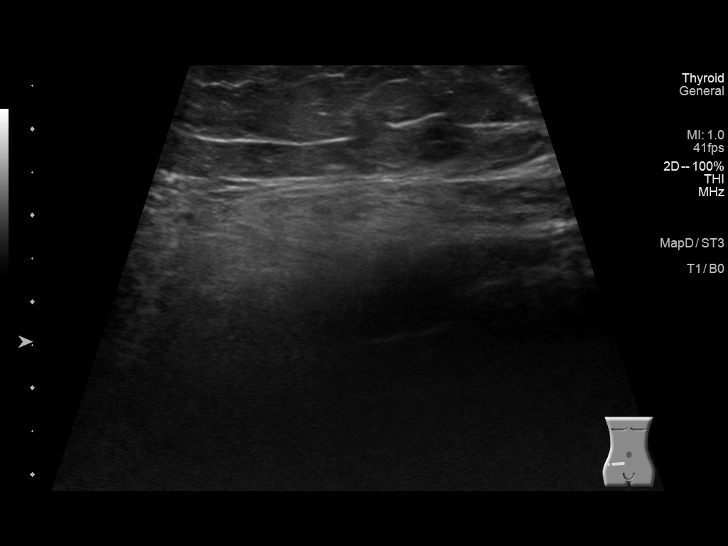
[im 11/32]
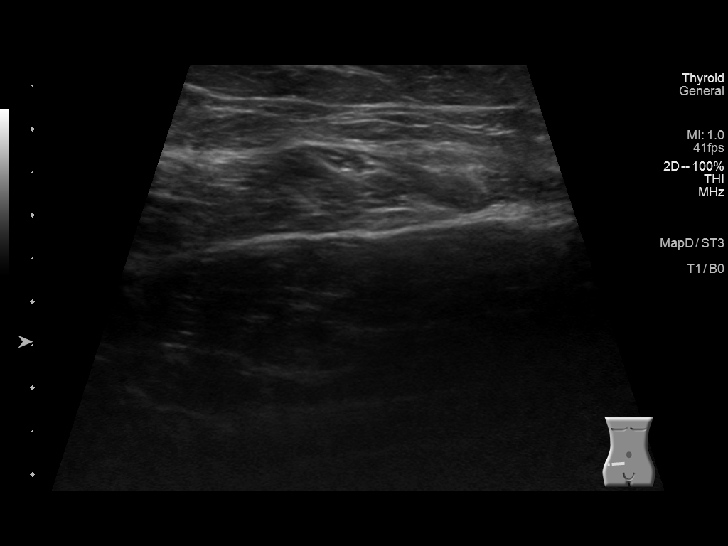
[im 12/32]
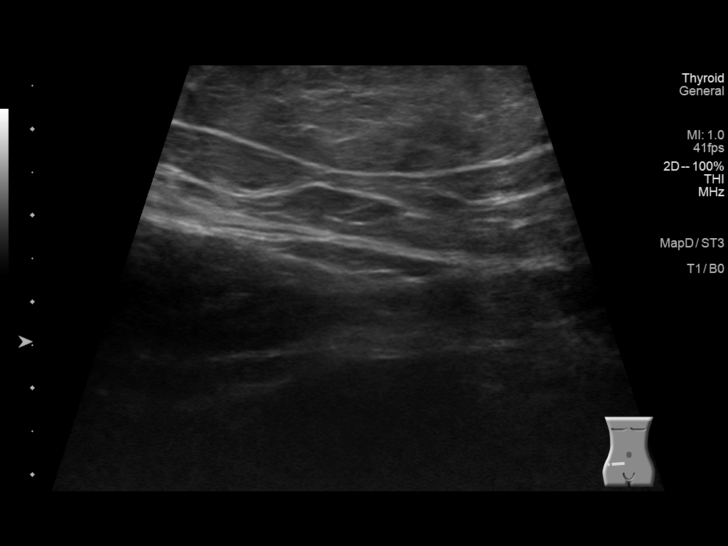
[im 15/32]
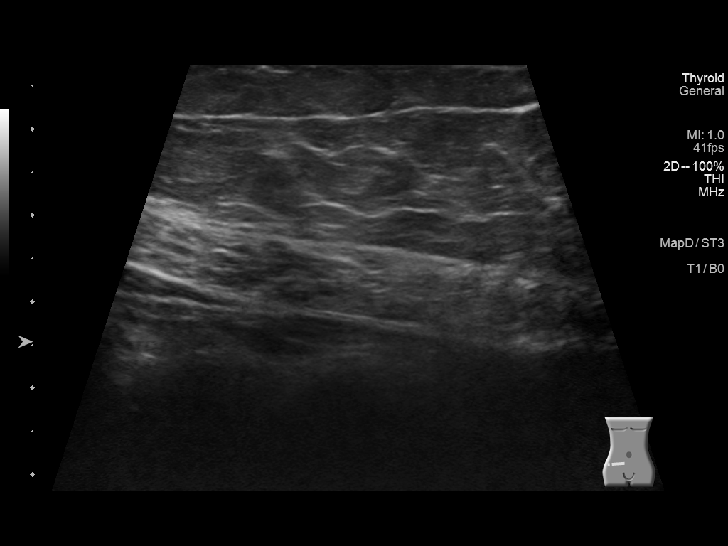
[im 17/32]
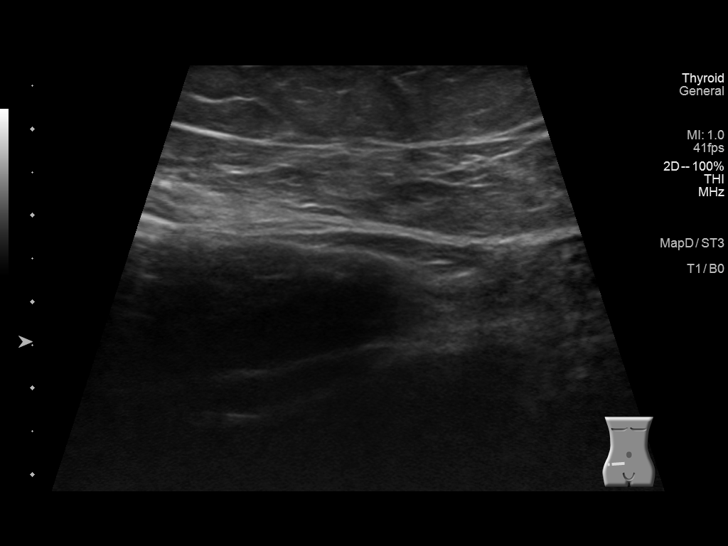
[im 20/32]
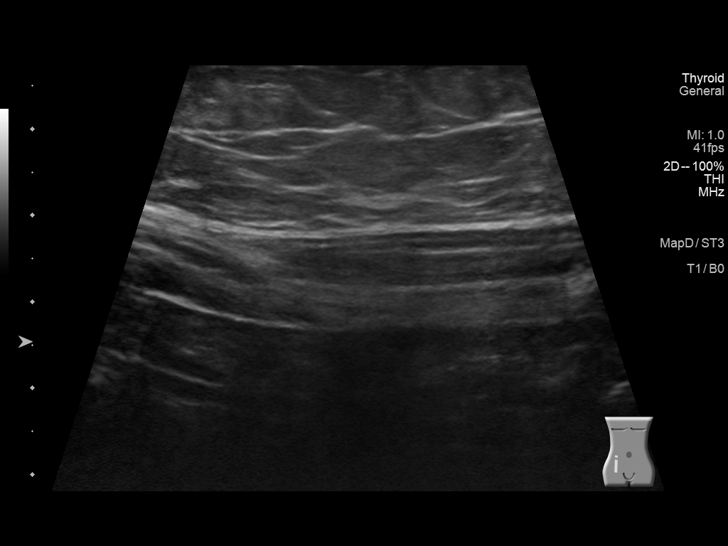
[im 21/32]
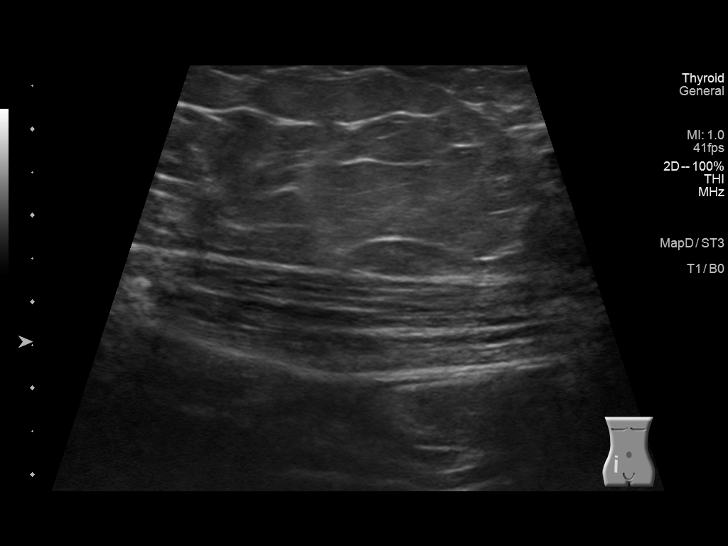
[im 24/32]
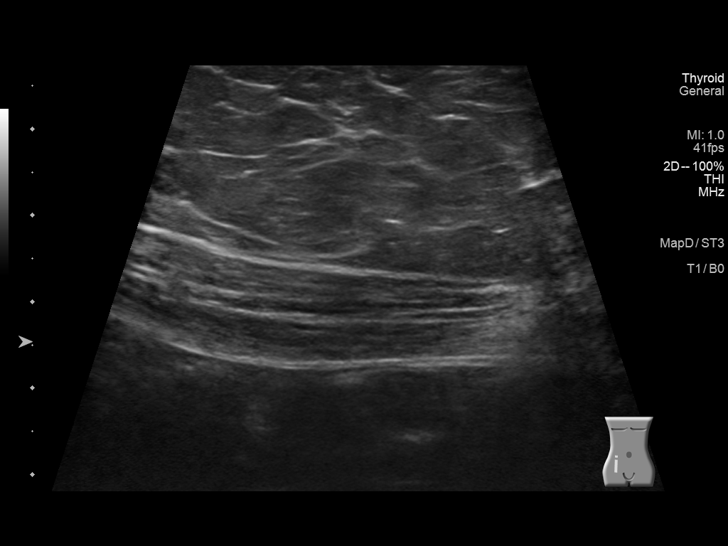
[im 26/32]
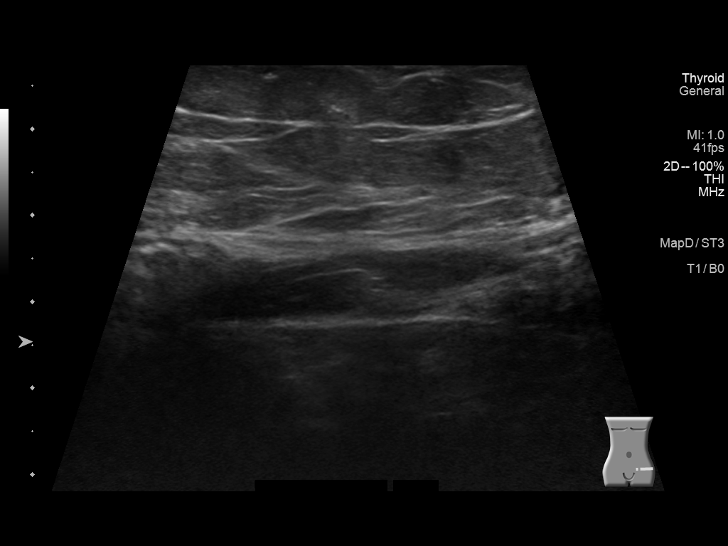
[im 29/32]
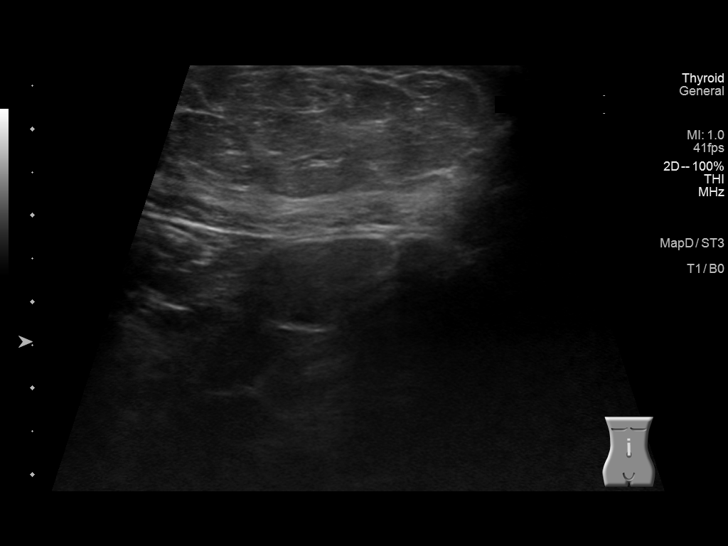
[im 32/32]
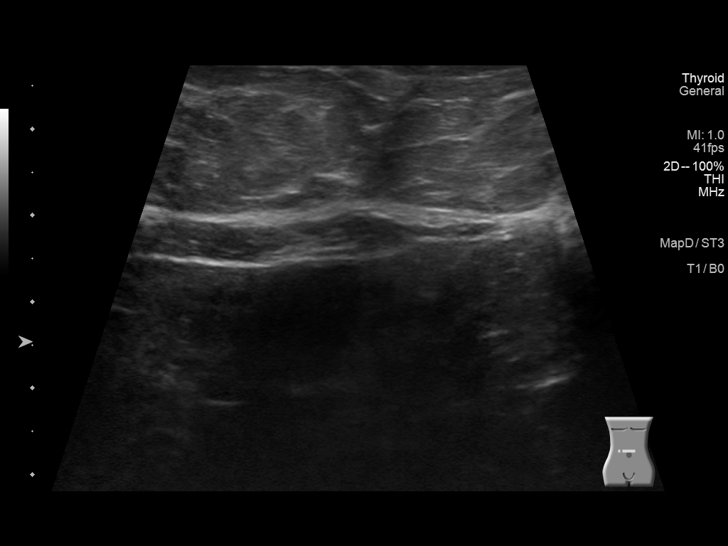

[14 of 25 positions shown; findings below may reference images not displayed]

FINDINGS: Limited ultrasound examination of the right anterior abdominal wall
in area of concern shows no evidence of mass or fluid collection.
IMPRESSION: No ultrasound evidence of mass or fluid collection in area of
concern right anterior abdominal wall.

## 2017-06-19 ENCOUNTER — Other Ambulatory Visit: Payer: Self-pay | Admitting: Family Medicine

## 2017-11-05 ENCOUNTER — Telehealth: Payer: Self-pay | Admitting: Family Medicine

## 2017-11-05 NOTE — Telephone Encounter (Signed)
Pt is requesting refills on IBU 800 MG tablet   [facility]

## 2017-11-05 NOTE — Telephone Encounter (Signed)
Not seen since 11/2016.

## 2017-11-06 ENCOUNTER — Other Ambulatory Visit: Payer: Self-pay | Admitting: Nurse Practitioner

## 2017-11-06 MED ORDER — IBUPROFEN 800 MG PO TABS: ORAL_TABLET | ORAL | 0 refills | Status: DC

## 2017-11-06 NOTE — Telephone Encounter (Signed)
Done

## 2017-12-05 ENCOUNTER — Encounter: Payer: BLUE CROSS/BLUE SHIELD | Admitting: Family Medicine

## 2017-12-15 ENCOUNTER — Telehealth: Payer: Self-pay | Admitting: Family Medicine

## 2017-12-15 DIAGNOSIS — Z125 Encounter for screening for malignant neoplasm of prostate: Secondary | ICD-10-CM

## 2017-12-15 DIAGNOSIS — Z114 Encounter for screening for human immunodeficiency virus [HIV]: Secondary | ICD-10-CM

## 2017-12-15 DIAGNOSIS — E785 Hyperlipidemia, unspecified: Secondary | ICD-10-CM

## 2017-12-15 DIAGNOSIS — Z79899 Other long term (current) drug therapy: Secondary | ICD-10-CM

## 2017-12-15 DIAGNOSIS — Z1159 Encounter for screening for other viral diseases: Secondary | ICD-10-CM

## 2017-12-15 NOTE — Telephone Encounter (Signed)
Blood work ordered in Epic. Patient notified. 

## 2017-12-15 NOTE — Telephone Encounter (Signed)
Lipid, liver, metabolic 7, PSA, CBC also hepatitis C antibody and HIV antibody-informed patient that this is part of CDC guidelines for every body

## 2017-12-15 NOTE — Telephone Encounter (Signed)
Patient's last lab work 11/2016: Lipid, Liver, Met 7, CBC with diff, PSA

## 2017-12-15 NOTE — Telephone Encounter (Signed)
Patient has physical on 1/21 and needing labs done.

## 2017-12-19 DIAGNOSIS — E785 Hyperlipidemia, unspecified: Secondary | ICD-10-CM | POA: Diagnosis not present

## 2017-12-19 DIAGNOSIS — Z125 Encounter for screening for malignant neoplasm of prostate: Secondary | ICD-10-CM | POA: Diagnosis not present

## 2017-12-19 DIAGNOSIS — Z114 Encounter for screening for human immunodeficiency virus [HIV]: Secondary | ICD-10-CM | POA: Diagnosis not present

## 2017-12-19 DIAGNOSIS — Z79899 Other long term (current) drug therapy: Secondary | ICD-10-CM | POA: Diagnosis not present

## 2017-12-20 LAB — CBC WITH DIFFERENTIAL/PLATELET
BASOS: 1 %
Basophils Absolute: 0 10*3/uL (ref 0.0–0.2)
EOS (ABSOLUTE): 0.1 10*3/uL (ref 0.0–0.4)
EOS: 2 %
HEMATOCRIT: 42.6 % (ref 37.5–51.0)
HEMOGLOBIN: 14.7 g/dL (ref 13.0–17.7)
IMMATURE GRANULOCYTES: 0 %
Immature Grans (Abs): 0 10*3/uL (ref 0.0–0.1)
LYMPHS ABS: 1.7 10*3/uL (ref 0.7–3.1)
Lymphs: 41 %
MCH: 31.3 pg (ref 26.6–33.0)
MCHC: 34.5 g/dL (ref 31.5–35.7)
MCV: 91 fL (ref 79–97)
MONOCYTES: 7 %
MONOS ABS: 0.3 10*3/uL (ref 0.1–0.9)
NEUTROS PCT: 49 %
Neutrophils Absolute: 2 10*3/uL (ref 1.4–7.0)
Platelets: 175 10*3/uL (ref 150–379)
RBC: 4.7 x10E6/uL (ref 4.14–5.80)
RDW: 12.7 % (ref 12.3–15.4)
WBC: 4.1 10*3/uL (ref 3.4–10.8)

## 2017-12-20 LAB — LIPID PANEL
CHOLESTEROL TOTAL: 152 mg/dL (ref 100–199)
Chol/HDL Ratio: 2.5 ratio (ref 0.0–5.0)
HDL: 61 mg/dL (ref >39)
LDL Calculated: 81 mg/dL (ref 0–99)
Triglycerides: 52 mg/dL (ref 0–149)
VLDL Cholesterol Cal: 10 mg/dL (ref 5–40)

## 2017-12-20 LAB — HEPATIC FUNCTION PANEL
ALBUMIN: 4.6 g/dL (ref 3.6–4.8)
ALK PHOS: 81 IU/L (ref 39–117)
ALT: 21 IU/L (ref 0–44)
AST: 21 IU/L (ref 0–40)
BILIRUBIN TOTAL: 0.6 mg/dL (ref 0.0–1.2)
Bilirubin, Direct: 0.18 mg/dL (ref 0.00–0.40)
Total Protein: 7.2 g/dL (ref 6.0–8.5)

## 2017-12-20 LAB — HIV ANTIBODY (ROUTINE TESTING W REFLEX): HIV Screen 4th Generation wRfx: NONREACTIVE

## 2017-12-20 LAB — BASIC METABOLIC PANEL
BUN/Creatinine Ratio: 13 (ref 10–24)
BUN: 14 mg/dL (ref 8–27)
CO2: 23 mmol/L (ref 20–29)
CREATININE: 1.04 mg/dL (ref 0.76–1.27)
Calcium: 9.4 mg/dL (ref 8.6–10.2)
Chloride: 103 mmol/L (ref 96–106)
GFR, EST AFRICAN AMERICAN: 89 mL/min/{1.73_m2} (ref >59)
GFR, EST NON AFRICAN AMERICAN: 77 mL/min/{1.73_m2} (ref >59)
Glucose: 92 mg/dL (ref 65–99)
POTASSIUM: 4.6 mmol/L (ref 3.5–5.2)
SODIUM: 141 mmol/L (ref 134–144)

## 2017-12-20 LAB — PSA: Prostate Specific Ag, Serum: 1.9 ng/mL (ref 0.0–4.0)

## 2017-12-20 LAB — HEPATITIS C ANTIBODY

## 2017-12-22 ENCOUNTER — Encounter: Payer: Self-pay | Admitting: Family Medicine

## 2017-12-22 ENCOUNTER — Ambulatory Visit (INDEPENDENT_AMBULATORY_CARE_PROVIDER_SITE_OTHER): Payer: BLUE CROSS/BLUE SHIELD | Admitting: Family Medicine

## 2017-12-22 VITALS — BP 124/76 | Ht 67.5 in | Wt 182.0 lb

## 2017-12-22 DIAGNOSIS — Z0000 Encounter for general adult medical examination without abnormal findings: Secondary | ICD-10-CM | POA: Diagnosis not present

## 2017-12-22 NOTE — Progress Notes (Signed)
   Subjective:    Patient ID: Nathan Reynolds, male    DOB: November 30, 1956, 62 y.o.   MRN: [id number]  HPI The patient comes in today for a wellness visit.    A review of their health history was completed.  A review of medications was also completed.  Any needed refills; No  Eating habits: Good  Falls/  MVA accidents in past few months: None  Regular exercise: Some  Specialist pt sees on regular basis: No  Preventative health issues were discussed.   Additional concerns: No   Review of Systems  Constitutional: Negative for activity change, appetite change and fever.  HENT: Negative for congestion and rhinorrhea.   Eyes: Negative for discharge.  Respiratory: Negative for cough and wheezing.   Cardiovascular: Negative for chest pain.  Gastrointestinal: Negative for abdominal pain, blood in stool and vomiting.  Genitourinary: Negative for difficulty urinating and frequency.  Musculoskeletal: Negative for neck pain.  Skin: Negative for rash.  Allergic/Immunologic: Negative for environmental allergies and food allergies.  Neurological: Negative for weakness and headaches.  Psychiatric/Behavioral: Negative for agitation.       Objective:   Physical Exam  Constitutional: He appears well-developed and well-nourished.  HENT:  Head: Normocephalic and atraumatic.  Right Ear: External ear normal.  Left Ear: External ear normal.  Nose: Nose normal.  Mouth/Throat: Oropharynx is clear and moist.  Eyes: Right eye exhibits no discharge. Left eye exhibits no discharge. No scleral icterus.  Neck: Normal range of motion. Neck supple. No thyromegaly present.  Cardiovascular: Normal rate, regular rhythm and normal heart sounds.  No murmur heard. Pulmonary/Chest: Effort normal and breath sounds normal. No respiratory distress. He has no wheezes.  Abdominal: Soft. Bowel sounds are normal. He exhibits no distension and no mass. There is no tenderness.  Genitourinary: Penis normal.    Musculoskeletal: Normal range of motion. He exhibits no edema.  Lymphadenopathy:    He has no cervical adenopathy.  Neurological: He is alert. He exhibits normal muscle tone. Coordination normal.  Skin: Skin is warm and dry. No erythema.  Psychiatric: He has a normal mood and affect. His behavior is normal. Judgment normal.   Labs reviewed with the patient  Prostate exam normal     Assessment & Plan:  Adult wellness-complete.wellness physical was conducted today. Importance of diet and exercise were discussed in detail. In addition to this a discussion regarding safety was also covered. We also reviewed over immunizations and gave recommendations regarding current immunization needed for age. In addition to this additional areas were also touched on including: Preventative health exams needed: Colonoscopy  2023  Patient was advised yearly wellness exam

## 2018-06-26 ENCOUNTER — Telehealth: Payer: Self-pay | Admitting: Family Medicine

## 2018-06-26 NOTE — Telephone Encounter (Signed)
Spoke with patient. Pt verbalized understanding  °

## 2018-06-26 NOTE — Telephone Encounter (Signed)
Based on what he is stating more than likely a viral illness Robitussin-DM is fine to use if having any allergy symptoms may use loratadine I do not recommend any decongestants because it can cause blood pressure to go up if patient is having progressive symptoms with chest congestion shortness of breath or fevers he should get it checked out, more than likely if he is not having those symptoms he will gradually get better over the next several days but if he gets worse he will need to be checked out either here, urgent care, or ER

## 2018-06-26 NOTE — Telephone Encounter (Signed)
Pt would like Dr. Nicki Reaper to call him in something for congestion, nose stopped up, coughing up mucus, low grade fever at first of week when symptoms started, no chills. If able to call in please send to [facility], [place]. CB# [phone].

## 2018-08-07 ENCOUNTER — Telehealth: Payer: Self-pay | Admitting: Family Medicine

## 2018-08-07 ENCOUNTER — Other Ambulatory Visit: Payer: Self-pay | Admitting: *Deleted

## 2018-08-07 MED ORDER — IBUPROFEN 800 MG PO TABS: ORAL_TABLET | ORAL | 1 refills | Status: DC

## 2018-08-07 NOTE — Telephone Encounter (Signed)
Patient requesting refill on ibuprofen (IBU) 800 MG tablet 90 tablet and if refill Is to be placed patient would like it to be sent to [facility], [place]. Please advise and notify patient.

## 2018-08-07 NOTE — Telephone Encounter (Signed)
Refill sent to pharm. Tried to call pt no answer.

## 2018-08-07 NOTE — Telephone Encounter (Signed)
It would be fine to send this to the pharmacy with one additional refill please have the pharmacy notify patient when the prescription is ready

## 2018-08-10 NOTE — Telephone Encounter (Signed)
Tried calling vm not set up. ?

## 2018-08-13 NOTE — Telephone Encounter (Signed)
Nathan Reynolds stated the patient has picked up his prescription

## 2018-11-09 DIAGNOSIS — H00021 Hordeolum internum right upper eyelid: Secondary | ICD-10-CM | POA: Diagnosis not present

## 2018-12-01 DIAGNOSIS — H00021 Hordeolum internum right upper eyelid: Secondary | ICD-10-CM | POA: Diagnosis not present

## 2018-12-14 ENCOUNTER — Telehealth: Payer: Self-pay | Admitting: Family Medicine

## 2018-12-14 DIAGNOSIS — Z125 Encounter for screening for malignant neoplasm of prostate: Secondary | ICD-10-CM

## 2018-12-14 DIAGNOSIS — Z79899 Other long term (current) drug therapy: Secondary | ICD-10-CM

## 2018-12-14 DIAGNOSIS — E785 Hyperlipidemia, unspecified: Secondary | ICD-10-CM

## 2018-12-14 NOTE — Telephone Encounter (Signed)
Lipid, liver, metabolic 7, PSA, CBC

## 2018-12-14 NOTE — Telephone Encounter (Signed)
Pt has physical 12/23/18 Dr.Scott for physical, requesting blood work.

## 2018-12-14 NOTE — Telephone Encounter (Signed)
12/19/2017 last labs drawn for [place] Lip,Hepatic function,bmet,psa,cbc,Hiv antibody, Hep C antibody. Please advise.

## 2018-12-14 NOTE — Telephone Encounter (Signed)
Patient is aware of all. 

## 2018-12-19 DIAGNOSIS — Z125 Encounter for screening for malignant neoplasm of prostate: Secondary | ICD-10-CM | POA: Diagnosis not present

## 2018-12-19 DIAGNOSIS — E785 Hyperlipidemia, unspecified: Secondary | ICD-10-CM | POA: Diagnosis not present

## 2018-12-19 DIAGNOSIS — Z79899 Other long term (current) drug therapy: Secondary | ICD-10-CM | POA: Diagnosis not present

## 2018-12-20 LAB — BASIC METABOLIC PANEL
BUN / CREAT RATIO: 17 (ref 10–24)
BUN: 17 mg/dL (ref 8–27)
CALCIUM: 9.4 mg/dL (ref 8.6–10.2)
CO2: 22 mmol/L (ref 20–29)
Chloride: 103 mmol/L (ref 96–106)
Creatinine, Ser: 0.98 mg/dL (ref 0.76–1.27)
GFR, EST AFRICAN AMERICAN: 95 mL/min/{1.73_m2} (ref >59)
GFR, EST NON AFRICAN AMERICAN: 82 mL/min/{1.73_m2} (ref >59)
GLUCOSE: 105 mg/dL — AB (ref 65–99)
POTASSIUM: 4.8 mmol/L (ref 3.5–5.2)
Sodium: 141 mmol/L (ref 134–144)

## 2018-12-20 LAB — CBC WITH DIFFERENTIAL/PLATELET
BASOS ABS: 0.1 10*3/uL (ref 0.0–0.2)
Basos: 1 %
EOS (ABSOLUTE): 0.1 10*3/uL (ref 0.0–0.4)
EOS: 3 %
HEMATOCRIT: 44.6 % (ref 37.5–51.0)
Hemoglobin: 15.4 g/dL (ref 13.0–17.7)
Immature Grans (Abs): 0 10*3/uL (ref 0.0–0.1)
Immature Granulocytes: 0 %
LYMPHS ABS: 1.7 10*3/uL (ref 0.7–3.1)
Lymphs: 44 %
MCH: 31.2 pg (ref 26.6–33.0)
MCHC: 34.5 g/dL (ref 31.5–35.7)
MCV: 91 fL (ref 79–97)
MONOS ABS: 0.4 10*3/uL (ref 0.1–0.9)
Monocytes: 9 %
Neutrophils Absolute: 1.7 10*3/uL (ref 1.4–7.0)
Neutrophils: 43 %
Platelets: 170 10*3/uL (ref 150–450)
RBC: 4.93 x10E6/uL (ref 4.14–5.80)
RDW: 11.7 % (ref 11.6–15.4)
WBC: 3.9 10*3/uL (ref 3.4–10.8)

## 2018-12-20 LAB — HEPATIC FUNCTION PANEL
ALK PHOS: 88 IU/L (ref 39–117)
ALT: 23 IU/L (ref 0–44)
AST: 20 IU/L (ref 0–40)
Albumin: 4.7 g/dL (ref 3.6–4.8)
Bilirubin Total: 0.5 mg/dL (ref 0.0–1.2)
Bilirubin, Direct: 0.14 mg/dL (ref 0.00–0.40)
Total Protein: 7.4 g/dL (ref 6.0–8.5)

## 2018-12-20 LAB — LIPID PANEL
Chol/HDL Ratio: 3 ratio (ref 0.0–5.0)
Cholesterol, Total: 169 mg/dL (ref 100–199)
HDL: 57 mg/dL (ref >39)
LDL Calculated: 97 mg/dL (ref 0–99)
TRIGLYCERIDES: 76 mg/dL (ref 0–149)
VLDL Cholesterol Cal: 15 mg/dL (ref 5–40)

## 2018-12-20 LAB — PSA: PROSTATE SPECIFIC AG, SERUM: 2.2 ng/mL (ref 0.0–4.0)

## 2018-12-23 ENCOUNTER — Ambulatory Visit (INDEPENDENT_AMBULATORY_CARE_PROVIDER_SITE_OTHER): Payer: BLUE CROSS/BLUE SHIELD | Admitting: Family Medicine

## 2018-12-23 ENCOUNTER — Encounter: Payer: Self-pay | Admitting: Family Medicine

## 2018-12-23 VITALS — BP 116/78 | Ht 67.0 in | Wt 189.2 lb

## 2018-12-23 DIAGNOSIS — Z0000 Encounter for general adult medical examination without abnormal findings: Secondary | ICD-10-CM | POA: Diagnosis not present

## 2018-12-23 MED ORDER — IBUPROFEN 800 MG PO TABS: ORAL_TABLET | ORAL | 1 refills | Status: DC

## 2018-12-23 NOTE — Progress Notes (Signed)
Subjective:    Patient ID: Nathan Reynolds, male    DOB: Apr 25, 1956, 63 y.o.   MRN: [id number]  HPI The patient comes in today for a wellness visit. This patient presents for wellness He is trying to eat healthy he is trying to stay physically active He denies being depressed states he sleeps well Denies any chest tightness pressure pain Is up-to-date on colonoscopy We did talk about flu vaccine he defers on this  We did review over his lab work which shows a fasting hyperglycemia also shows slight elevation on LDL but he has excellent HDL A review of their health history was completed.  A review of medications was also completed.  Any needed refills; Ibu 800 #90  Eating habits:   Falls/  MVA accidents in past few months: none  Regular exercise: some  Specialist pt sees on regular basis: none  Preventative health issues were discussed.   Additional concerns: none   Review of Systems  Constitutional: Negative for activity change, appetite change and fever.  HENT: Negative for congestion and rhinorrhea.   Eyes: Negative for discharge.  Respiratory: Negative for cough and wheezing.   Cardiovascular: Negative for chest pain.  Gastrointestinal: Negative for abdominal pain, blood in stool and vomiting.  Genitourinary: Negative for difficulty urinating and frequency.  Musculoskeletal: Negative for neck pain.  Skin: Negative for rash.  Allergic/Immunologic: Negative for environmental allergies and food allergies.  Neurological: Negative for weakness and headaches.  Psychiatric/Behavioral: Negative for agitation.       Objective:   Physical Exam Constitutional:      Appearance: He is well-developed.  HENT:     Head: Normocephalic and atraumatic.     Right Ear: External ear normal.     Left Ear: External ear normal.     Nose: Nose normal.  Eyes:     Pupils: Pupils are equal, round, and reactive to light.  Neck:     Musculoskeletal: Normal range of motion and neck  supple.     Thyroid: No thyromegaly.  Cardiovascular:     Rate and Rhythm: Normal rate and regular rhythm.     Heart sounds: Normal heart sounds. No murmur.  Pulmonary:     Effort: Pulmonary effort is normal. No respiratory distress.     Breath sounds: Normal breath sounds. No wheezing.  Abdominal:     General: Bowel sounds are normal. There is no distension.     Palpations: Abdomen is soft. There is no mass.     Tenderness: There is no abdominal tenderness.  Genitourinary:    Penis: Normal.   Musculoskeletal: Normal range of motion.  Lymphadenopathy:     Cervical: No cervical adenopathy.  Skin:    General: Skin is warm and dry.     Findings: No erythema.  Neurological:     Mental Status: He is alert.     Motor: No abnormal muscle tone.  Psychiatric:        Behavior: Behavior normal.        Judgment: Judgment normal.      Prostate exam normal PSA is gone up a little bit but not worrisome at this point     Assessment & Plan:  Adult wellness-complete.wellness physical was conducted today. Importance of diet and exercise were discussed in detail.  In addition to this a discussion regarding safety was also covered. We also reviewed over immunizations and gave recommendations regarding current immunization needed for age.  In addition to this additional areas were also touched  on including: Preventative health exams needed:  Colonoscopy patient is doing well currently does not need his colonoscopy till 2023 Slight hyperlipidemia patient encouraged healthy diet try to lose little bit of weight exercise on a regular basis.  Notify us if any problems follow-up in 1 year for wellness Patient was advised yearly wellness exam

## 2018-12-23 NOTE — Patient Instructions (Signed)
Results for orders placed or performed in visit on 12/14/18  Hepatic function panel  Result Value Ref Range   Total Protein 7.4 6.0 - 8.5 g/dL   Albumin 4.7 3.6 - 4.8 g/dL   Bilirubin Total 0.5 0.0 - 1.2 mg/dL   Bilirubin, Direct 0.14 0.00 - 0.40 mg/dL   Alkaline Phosphatase 88 39 - 117 IU/L   AST 20 0 - 40 IU/L   ALT 23 0 - 44 IU/L  Lipid panel  Result Value Ref Range   Cholesterol, Total 169 100 - 199 mg/dL   Triglycerides 76 0 - 149 mg/dL   HDL 57 >39 mg/dL   VLDL Cholesterol Cal 15 5 - 40 mg/dL   LDL Calculated 97 0 - 99 mg/dL   Chol/HDL Ratio 3.0 0.0 - 5.0 ratio  Basic metabolic panel  Result Value Ref Range   Glucose 105 (H) 65 - 99 mg/dL   BUN 17 8 - 27 mg/dL   Creatinine, Ser 0.98 0.76 - 1.27 mg/dL   GFR calc non Af Amer 82 >59 mL/min/1.73   GFR calc Af Amer 95 >59 mL/min/1.73   BUN/Creatinine Ratio 17 10 - 24   Sodium 141 134 - 144 mmol/L   Potassium 4.8 3.5 - 5.2 mmol/L   Chloride 103 96 - 106 mmol/L   CO2 22 20 - 29 mmol/L   Calcium 9.4 8.6 - 10.2 mg/dL  PSA  Result Value Ref Range   Prostate Specific Ag, Serum 2.2 0.0 - 4.0 ng/mL  CBC with Differential/Platelet  Result Value Ref Range   WBC 3.9 3.4 - 10.8 x10E3/uL   RBC 4.93 4.14 - 5.80 x10E6/uL   Hemoglobin 15.4 13.0 - 17.7 g/dL   Hematocrit 44.6 37.5 - 51.0 %   MCV 91 79 - 97 fL   MCH 31.2 26.6 - 33.0 pg   MCHC 34.5 31.5 - 35.7 g/dL   RDW 11.7 11.6 - 15.4 %   Platelets 170 150 - 450 x10E3/uL   Neutrophils 43 Not Estab. %   Lymphs 44 Not Estab. %   Monocytes 9 Not Estab. %   Eos 3 Not Estab. %   Basos 1 Not Estab. %   Neutrophils Absolute 1.7 1.4 - 7.0 x10E3/uL   Lymphocytes Absolute 1.7 0.7 - 3.1 x10E3/uL   Monocytes Absolute 0.4 0.1 - 0.9 x10E3/uL   EOS (ABSOLUTE) 0.1 0.0 - 0.4 x10E3/uL   Basophils Absolute 0.1 0.0 - 0.2 x10E3/uL   Immature Granulocytes 0 Not Estab. %   Immature Grans (Abs) 0.0 0.0 - 0.1 x10E3/uL

## 2019-09-07 ENCOUNTER — Telehealth: Payer: Self-pay | Admitting: Family Medicine

## 2019-09-07 MED ORDER — IBUPROFEN 800 MG PO TABS: ORAL_TABLET | ORAL | 1 refills | Status: DC

## 2019-09-07 NOTE — Telephone Encounter (Signed)
Prescription sent electronically to pharmacy. Telephone call-voicemail not set up

## 2019-09-07 NOTE — Telephone Encounter (Signed)
May have refill for ibuprofen 800 mg, 1 p.o. 3 times daily as needed, 1 refill

## 2019-09-07 NOTE — Telephone Encounter (Signed)
Patient needing a refill on Ibuprofen 800 mg called into [place]

## 2019-09-08 NOTE — Telephone Encounter (Signed)
Patient picked up prescription per [facility]

## 2019-11-23 ENCOUNTER — Telehealth: Payer: Self-pay | Admitting: Family Medicine

## 2019-11-23 DIAGNOSIS — Z125 Encounter for screening for malignant neoplasm of prostate: Secondary | ICD-10-CM

## 2019-11-23 DIAGNOSIS — Z13220 Encounter for screening for lipoid disorders: Secondary | ICD-10-CM

## 2019-11-23 DIAGNOSIS — Z0000 Encounter for general adult medical examination without abnormal findings: Secondary | ICD-10-CM

## 2019-11-23 NOTE — Telephone Encounter (Signed)
Please order vitamin D level to be added to his lab work thank you

## 2019-11-23 NOTE — Telephone Encounter (Signed)
Last labs 12/19/18 vit d, lipid, liver, glucose

## 2019-11-23 NOTE — Telephone Encounter (Signed)
CBC, lipid, CMP, PSA

## 2019-11-23 NOTE — Telephone Encounter (Signed)
Pt has CPE on 1/28. Would like lab work ordered.

## 2019-11-24 NOTE — Telephone Encounter (Signed)
Orders put in and pt notified.  

## 2019-12-24 DIAGNOSIS — Z125 Encounter for screening for malignant neoplasm of prostate: Secondary | ICD-10-CM | POA: Diagnosis not present

## 2019-12-24 DIAGNOSIS — Z13220 Encounter for screening for lipoid disorders: Secondary | ICD-10-CM | POA: Diagnosis not present

## 2019-12-24 DIAGNOSIS — Z0000 Encounter for general adult medical examination without abnormal findings: Secondary | ICD-10-CM | POA: Diagnosis not present

## 2019-12-25 LAB — CBC WITH DIFFERENTIAL/PLATELET
Basophils Absolute: 0 10*3/uL (ref 0.0–0.2)
Basos: 1 %
EOS (ABSOLUTE): 0.1 10*3/uL (ref 0.0–0.4)
Eos: 2 %
Hematocrit: 43 % (ref 37.5–51.0)
Hemoglobin: 15 g/dL (ref 13.0–17.7)
Immature Grans (Abs): 0 10*3/uL (ref 0.0–0.1)
Immature Granulocytes: 0 %
Lymphocytes Absolute: 2.1 10*3/uL (ref 0.7–3.1)
Lymphs: 43 %
MCH: 31.3 pg (ref 26.6–33.0)
MCHC: 34.9 g/dL (ref 31.5–35.7)
MCV: 90 fL (ref 79–97)
Monocytes Absolute: 0.4 10*3/uL (ref 0.1–0.9)
Monocytes: 8 %
Neutrophils Absolute: 2.2 10*3/uL (ref 1.4–7.0)
Neutrophils: 46 %
Platelets: 170 10*3/uL (ref 150–450)
RBC: 4.8 x10E6/uL (ref 4.14–5.80)
RDW: 11.8 % (ref 11.6–15.4)
WBC: 4.8 10*3/uL (ref 3.4–10.8)

## 2019-12-25 LAB — PSA: Prostate Specific Ag, Serum: 2.9 ng/mL (ref 0.0–4.0)

## 2019-12-25 LAB — COMPREHENSIVE METABOLIC PANEL
ALT: 22 IU/L (ref 0–44)
AST: 17 IU/L (ref 0–40)
Albumin/Globulin Ratio: 1.7 (ref 1.2–2.2)
Albumin: 4.7 g/dL (ref 3.8–4.8)
Alkaline Phosphatase: 98 IU/L (ref 39–117)
BUN/Creatinine Ratio: 15 (ref 10–24)
BUN: 17 mg/dL (ref 8–27)
Bilirubin Total: 0.7 mg/dL (ref 0.0–1.2)
CO2: 27 mmol/L (ref 20–29)
Calcium: 9.9 mg/dL (ref 8.6–10.2)
Chloride: 102 mmol/L (ref 96–106)
Creatinine, Ser: 1.1 mg/dL (ref 0.76–1.27)
GFR calc Af Amer: 82 mL/min/{1.73_m2} (ref >59)
GFR calc non Af Amer: 71 mL/min/{1.73_m2} (ref >59)
Globulin, Total: 2.8 g/dL (ref 1.5–4.5)
Glucose: 84 mg/dL (ref 65–99)
Potassium: 4.7 mmol/L (ref 3.5–5.2)
Sodium: 140 mmol/L (ref 134–144)
Total Protein: 7.5 g/dL (ref 6.0–8.5)

## 2019-12-25 LAB — LIPID PANEL
Chol/HDL Ratio: 2.9 ratio (ref 0.0–5.0)
Cholesterol, Total: 176 mg/dL (ref 100–199)
HDL: 61 mg/dL (ref >39)
LDL Chol Calc (NIH): 106 mg/dL — ABNORMAL HIGH (ref 0–99)
Triglycerides: 43 mg/dL (ref 0–149)
VLDL Cholesterol Cal: 9 mg/dL (ref 5–40)

## 2019-12-25 LAB — VITAMIN D 25 HYDROXY (VIT D DEFICIENCY, FRACTURES): Vit D, 25-Hydroxy: 62.5 ng/mL (ref 30.0–100.0)

## 2019-12-30 ENCOUNTER — Ambulatory Visit (INDEPENDENT_AMBULATORY_CARE_PROVIDER_SITE_OTHER): Payer: BC Managed Care – PPO | Admitting: Family Medicine

## 2019-12-30 ENCOUNTER — Encounter: Payer: Self-pay | Admitting: Family Medicine

## 2019-12-30 ENCOUNTER — Other Ambulatory Visit: Payer: Self-pay

## 2019-12-30 VITALS — BP 128/78 | Ht 67.0 in | Wt 187.0 lb

## 2019-12-30 DIAGNOSIS — Z1211 Encounter for screening for malignant neoplasm of colon: Secondary | ICD-10-CM

## 2019-12-30 DIAGNOSIS — Z125 Encounter for screening for malignant neoplasm of prostate: Secondary | ICD-10-CM

## 2019-12-30 DIAGNOSIS — Z0000 Encounter for general adult medical examination without abnormal findings: Secondary | ICD-10-CM

## 2019-12-30 MED ORDER — IBUPROFEN 800 MG PO TABS: ORAL_TABLET | ORAL | 1 refills | Status: DC

## 2019-12-30 NOTE — Progress Notes (Signed)
Subjective:    Patient ID: Nathan Reynolds, male    DOB: 12/12/1955, 64 y.o.   MRN: [id number]  HPI The patient comes in today for a wellness visit.    A review of their health history was completed.  A review of medications was also completed.  Any needed refills; ibuprofen 800mg   Eating habits: health conscious  Falls/  MVA accidents in past few months: none  Regular exercise: very little  Specialist pt sees on regular basis: none  Preventative health issues were discussed.   Additional concerns:   Results for orders placed or performed in visit on 11/23/19  Lipid Profile  Result Value Ref Range   Cholesterol, Total 176 100 - 199 mg/dL   Triglycerides 43 0 - 149 mg/dL   HDL 61 >39 mg/dL   VLDL Cholesterol Cal 9 5 - 40 mg/dL   LDL Chol Calc (NIH) 106 (H) 0 - 99 mg/dL   Chol/HDL Ratio 2.9 0.0 - 5.0 ratio  Comprehensive metabolic panel  Result Value Ref Range   Glucose 84 65 - 99 mg/dL   BUN 17 8 - 27 mg/dL   Creatinine, Ser 1.10 0.76 - 1.27 mg/dL   GFR calc non Af Amer 71 >59 mL/min/1.73   GFR calc Af Amer 82 >59 mL/min/1.73   BUN/Creatinine Ratio 15 10 - 24   Sodium 140 134 - 144 mmol/L   Potassium 4.7 3.5 - 5.2 mmol/L   Chloride 102 96 - 106 mmol/L   CO2 27 20 - 29 mmol/L   Calcium 9.9 8.6 - 10.2 mg/dL   Total Protein 7.5 6.0 - 8.5 g/dL   Albumin 4.7 3.8 - 4.8 g/dL   Globulin, Total 2.8 1.5 - 4.5 g/dL   Albumin/Globulin Ratio 1.7 1.2 - 2.2   Bilirubin Total 0.7 0.0 - 1.2 mg/dL   Alkaline Phosphatase 98 39 - 117 IU/L   AST 17 0 - 40 IU/L   ALT 22 0 - 44 IU/L  PSA  Result Value Ref Range   Prostate Specific Ag, Serum 2.9 0.0 - 4.0 ng/mL  Vitamin D 25 hydroxy  Result Value Ref Range   Vit D, 25-Hydroxy 62.5 30.0 - 100.0 ng/mL  CBC with Diff  Result Value Ref Range   WBC 4.8 3.4 - 10.8 x10E3/uL   RBC 4.80 4.14 - 5.80 x10E6/uL   Hemoglobin 15.0 13.0 - 17.7 g/dL   Hematocrit 43.0 37.5 - 51.0 %   MCV 90 79 - 97 fL   MCH 31.3 26.6 - 33.0 pg   MCHC 34.9  31.5 - 35.7 g/dL   RDW 11.8 11.6 - 15.4 %   Platelets 170 150 - 450 x10E3/uL   Neutrophils 46 Not Estab. %   Lymphs 43 Not Estab. %   Monocytes 8 Not Estab. %   Eos 2 Not Estab. %   Basos 1 Not Estab. %   Neutrophils Absolute 2.2 1.4 - 7.0 x10E3/uL   Lymphocytes Absolute 2.1 0.7 - 3.1 x10E3/uL   Monocytes Absolute 0.4 0.1 - 0.9 x10E3/uL   EOS (ABSOLUTE) 0.1 0.0 - 0.4 x10E3/uL   Basophils Absolute 0.0 0.0 - 0.2 x10E3/uL   Immature Granulocytes 0 Not Estab. %   Immature Grans (Abs) 0.0 0.0 - 0.1 x10E3/uL     Review of Systems  Constitutional: Negative for activity change, appetite change and fever.  HENT: Negative for congestion and rhinorrhea.   Eyes: Negative for discharge.  Respiratory: Negative for cough and wheezing.   Cardiovascular: Negative for chest  pain.  Gastrointestinal: Negative for abdominal pain, blood in stool and vomiting.  Genitourinary: Negative for difficulty urinating and frequency.  Musculoskeletal: Negative for neck pain.  Skin: Negative for rash.  Allergic/Immunologic: Negative for environmental allergies and food allergies.  Neurological: Negative for weakness and headaches.  Psychiatric/Behavioral: Negative for agitation.       Objective:   Physical Exam Constitutional:      Appearance: He is well-developed.  HENT:     Head: Normocephalic and atraumatic.     Right Ear: External ear normal.     Left Ear: External ear normal.     Nose: Nose normal.  Eyes:     Pupils: Pupils are equal, round, and reactive to light.  Neck:     Thyroid: No thyromegaly.  Cardiovascular:     Rate and Rhythm: Normal rate and regular rhythm.     Heart sounds: Normal heart sounds. No murmur.  Pulmonary:     Effort: Pulmonary effort is normal. No respiratory distress.     Breath sounds: Normal breath sounds. No wheezing.  Abdominal:     General: Bowel sounds are normal. There is no distension.     Palpations: Abdomen is soft. There is no mass.     Tenderness: There  is no abdominal tenderness.  Genitourinary:    Penis: Normal.   Musculoskeletal:        General: Normal range of motion.     Cervical back: Normal range of motion and neck supple.  Lymphadenopathy:     Cervical: No cervical adenopathy.  Skin:    General: Skin is warm and dry.     Findings: No erythema.  Neurological:     Mental Status: He is alert.     Motor: No abnormal muscle tone.  Psychiatric:        Behavior: Behavior normal.        Judgment: Judgment normal.    Prostate exam is normal PSA is gone up to 2.9 Recheck it again in 6 months       Assessment & Plan:  Adult wellness-complete.wellness physical was conducted today. Importance of diet and exercise were discussed in detail.  In addition to this a discussion regarding safety was also covered. We also reviewed over immunizations and gave recommendations regarding current immunization needed for age.  In addition to this additional areas were also touched on including: Preventative health exams needed:  Colonoscopy he is due later this spring  Patient was advised yearly wellness exam Patient prostate exam was normal but his PSA is gone up from last year therefore recheck it again in 6 months time we will put this into the reminder file

## 2019-12-31 NOTE — Progress Notes (Signed)
PSA reminder to be done in 6 months placed in reminder file

## 2020-01-05 ENCOUNTER — Encounter: Payer: Self-pay | Admitting: Family Medicine

## 2020-01-06 ENCOUNTER — Encounter: Payer: Self-pay | Admitting: Internal Medicine

## 2020-02-06 DIAGNOSIS — Z23 Encounter for immunization: Secondary | ICD-10-CM | POA: Diagnosis not present

## 2020-02-27 DIAGNOSIS — Z23 Encounter for immunization: Secondary | ICD-10-CM | POA: Diagnosis not present

## 2020-03-16 ENCOUNTER — Other Ambulatory Visit: Payer: Self-pay

## 2020-03-16 ENCOUNTER — Ambulatory Visit (INDEPENDENT_AMBULATORY_CARE_PROVIDER_SITE_OTHER): Payer: Self-pay | Admitting: *Deleted

## 2020-03-16 DIAGNOSIS — Z86010 Personal history of colonic polyps: Secondary | ICD-10-CM

## 2020-03-16 MED ORDER — PEG 3350-KCL-NA BICARB-NACL 420 G PO SOLR: 4000.0000 mL | Freq: Once | ORAL | 0 refills | Status: AC

## 2020-03-16 NOTE — Progress Notes (Addendum)
Gastroenterology Pre-Procedure Review  Request Date: 03/16/2020 Requesting Physician: Dr. 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 Diabetes Melitis: no 2. Joint replacements in the past 12 months: no 3. Major health problems in the past 3 months: no 4. Has an artificial valve or MVP: no 5. Has a defibrillator: no 6. Has been advised in past to take antibiotics in advance of a procedure like teeth cleaning: no 7. Family history of colon cancer: no  8. Alcohol Use: no 9. Illicit drug Use: no 10. History of sleep apnea: no  11. History of coronary artery or other vascular stents placed within the last 12 months: no 12. History of any prior anesthesia complications:  13. There is no height or weight on file to calculate BMI. ht: 5'8 wt: 185 lbs    MEDICATIONS & ALLERGIES:    Patient reports the following regarding taking any blood thinners:   Plavix? no Aspirin? no Coumadin? no Brilinta? no Xarelto? no Eliquis? no Pradaxa? no Savaysa? no Effient? no  Patient confirms/reports the following medications:  Current Outpatient Medications  Medication Sig Dispense Refill  . BIOTIN PO Take 1 tablet by mouth daily.     . Cholecalciferol (VITAMIN D3) 5000 units CAPS Take 5,000 Units by mouth daily.    . fish oil-omega-3 fatty acids 1000 MG capsule Take 2 g by mouth daily.    Marland Kitchen ibuprofen (ADVIL) 800 MG tablet TAKE 1 TABLET BY MOUTH EVERY EIGHT HOURS AS NEEDED. (Patient taking differently: as needed. TAKE 1 TABLET BY MOUTH EVERY EIGHT HOURS AS NEEDED.) 90 tablet 1  . Multiple Vitamin (MULTIVITAMIN) tablet Take 1 tablet by mouth daily.    . vitamin B-12 (CYANOCOBALAMIN) 500 MCG tablet Take 500 mcg by mouth daily.     No current facility-administered medications for this visit.    Patient confirms/reports the following allergies:  No Known  Allergies  No orders of the defined types were placed in this encounter.   AUTHORIZATION INFORMATION Primary Insurance: [place],  [place] #:[id number] ,  Group #: [id number] Pre-Cert / Josem Kaufmann required: No, not required  SCHEDULE INFORMATION: Procedure has been scheduled as follows:  Date: 05/19/2020, Time: 10:30  Location: [facility] with Dr. Gala Romney  This Gastroenterology Pre-Precedure Review Form is being routed to the following provider(s): Roseanne Kaufman, NP

## 2020-03-16 NOTE — Patient Instructions (Signed)
Nathan Reynolds   October 30, 1956 MRN: [id number]    Procedure Date: 05/19/2020 Time to register: 9:30 am Place to register: [facility] Procedure Time: 10:30 am Scheduled provider: Dr. Gala Romney  PREPARATION FOR COLONOSCOPY WITH TRI-LYTE SPLIT PREP  Please notify us immediately if you are diabetic, take iron supplements, or if you are on Coumadin or any other blood thinners.   Please hold the following medications: n/a  You will need to purchase 1 fleet enema and 1 box of Bisacodyl '5mg'$  tablets.   2 DAYS BEFORE PROCEDURE:  DATE: 05/17/2020   DAY: Wednesday Begin clear liquid diet AFTER your lunch meal. NO SOLID FOODS after this point.  1 DAY BEFORE PROCEDURE:  DATE: 05/18/2020   DAY: Thursday Continue clear liquids the entire day - NO SOLID FOOD.   Diabetic medications adjustments for today: n/a  At 2:00 pm:  Take 2 Bisacodyl tablets.   At 4:00pm:  Start drinking your solution. Make sure you mix well per instructions on the bottle. Try to drink 1 (one) 8 ounce glass every 10-15 minutes until you have consumed HALF the jug. You should complete by 6:00pm.You must keep the left over solution refrigerated until completed next day.  Continue clear liquids. You must drink plenty of clear liquids to prevent dehyration and kidney failure.     DAY OF PROCEDURE:   DATE: 05/19/2020   DAY: Friday If you take medications for your heart, blood pressure or breathing, you may take these medications.  Diabetic medications adjustments for today: n/a  Five hours before your procedure time @ 5:30 am:  Finish remaining amout of bowel prep, drinking 1 (one) 8 ounce glass every 10-15 minutes until complete. You have two hours to consume remaining prep.   Three hours before your procedure time @ 7:30 am:  Nothing by mouth.   At least one hour before going to the hospital:  Give yourself one Fleet enema. You may take your morning medications with sip of water unless we have instructed otherwise.       Please see below for Dietary Information.  CLEAR LIQUIDS INCLUDE:  Water Jello (NOT red in color)   Ice Popsicles (NOT red in color)   Tea (sugar ok, no milk/cream) Powdered fruit flavored drinks  Coffee (sugar ok, no milk/cream) Gatorade/ Lemonade/ Kool-Aid  (NOT red in color)   Juice: apple, white grape, white cranberry Soft drinks  Clear bullion, consomme, broth (fat free beef/chicken/vegetable)  Carbonated beverages (any kind)  Strained chicken noodle soup Hard Candy   Remember: Clear liquids are liquids that will allow you to see your fingers on the other side of a clear glass. Be sure liquids are NOT red in color, and not cloudy, but CLEAR.  DO NOT EAT OR DRINK ANY OF THE FOLLOWING:  Dairy products of any kind   Cranberry juice Tomato juice / V8 juice   Grapefruit juice Orange juice     Red grape juice  Do not eat any solid foods, including such foods as: cereal, oatmeal, yogurt, fruits, vegetables, creamed soups, eggs, bread, crackers, pureed foods in a blender, etc.   HELPFUL HINTS FOR DRINKING PREP SOLUTION:   Make sure prep is extremely cold. Mix and refrigerate the the morning of the prep. You may also put in the freezer.   You may try mixing some Crystal Light or Country Time Lemonade if you prefer. Mix in small amounts; add more if necessary.  Try drinking through a straw  Rinse mouth with water  or a mouthwash between glasses, to remove after-taste.  Try sipping on a cold beverage /ice/ popsicles between glasses of prep.  Place a piece of sugar-free hard candy in mouth between glasses.  If you become nauseated, try consuming smaller amounts, or stretch out the time between glasses. Stop for 30-60 minutes, then slowly start back drinking.        OTHER INSTRUCTIONS  You will need a responsible adult at least 64 years of age to accompany you and drive you home. This person must remain in the waiting room during your procedure. The hospital will cancel  your procedure if you do not have a responsible adult with you.   1. Wear loose fitting clothing that is easily removed. 2. Leave jewelry and other valuables at home.  3. Remove all body piercing jewelry and leave at home. 4. Total time from sign-in until discharge is approximately 2-3 hours. 5. You should go home directly after your procedure and rest. You can resume normal activities the day after your procedure. 6. The day of your procedure you should not:  Drive  Make legal decisions  Operate machinery  Drink alcohol  Return to work   You may call the office (Dept: [phone]) before 5:00pm, or page the doctor on call [phone]) after 5:00pm, for further instructions, if necessary.   Insurance Information YOU WILL NEED TO CHECK WITH YOUR INSURANCE COMPANY FOR THE BENEFITS OF COVERAGE YOU HAVE FOR THIS PROCEDURE.  UNFORTUNATELY, NOT ALL INSURANCE COMPANIES HAVE BENEFITS TO COVER ALL OR PART OF THESE TYPES OF PROCEDURES.  IT IS YOUR RESPONSIBILITY TO CHECK YOUR BENEFITS, HOWEVER, WE WILL BE GLAD TO ASSIST YOU WITH ANY CODES YOUR INSURANCE COMPANY MAY NEED.    PLEASE NOTE THAT MOST INSURANCE COMPANIES WILL NOT COVER A SCREENING COLONOSCOPY FOR PEOPLE UNDER THE AGE OF 50  IF YOU HAVE BCBS INSURANCE, YOU MAY HAVE BENEFITS FOR A SCREENING COLONOSCOPY BUT IF POLYPS ARE FOUND THE DIAGNOSIS WILL CHANGE AND THEN YOU MAY HAVE A DEDUCTIBLE THAT WILL NEED TO BE MET. SO PLEASE MAKE SURE YOU CHECK YOUR BENEFITS FOR A SCREENING COLONOSCOPY AS WELL AS A DIAGNOSTIC COLONOSCOPY.

## 2020-04-10 NOTE — Progress Notes (Signed)
Appropriate.

## 2020-05-17 ENCOUNTER — Other Ambulatory Visit (HOSPITAL_COMMUNITY): Admission: RE | Admit: 2020-05-17 | Payer: BC Managed Care – PPO | Source: Ambulatory Visit

## 2020-05-18 ENCOUNTER — Other Ambulatory Visit (HOSPITAL_COMMUNITY)
Admission: RE | Admit: 2020-05-18 | Discharge: 2020-05-18 | Disposition: A | Discharge status: Home / Self Care | Payer: BC Managed Care – PPO | Source: Ambulatory Visit | Attending: Internal Medicine | Admitting: Internal Medicine

## 2020-05-18 ENCOUNTER — Other Ambulatory Visit: Payer: Self-pay

## 2020-05-18 DIAGNOSIS — Z01812 Encounter for preprocedural laboratory examination: Secondary | ICD-10-CM | POA: Diagnosis not present

## 2020-05-18 DIAGNOSIS — Z20822 Contact with and (suspected) exposure to covid-19: Secondary | ICD-10-CM | POA: Insufficient documentation

## 2020-05-18 LAB — SARS CORONAVIRUS 2 (TAT 6-24 HRS): SARS Coronavirus 2: NEGATIVE

## 2020-05-19 ENCOUNTER — Ambulatory Visit (HOSPITAL_COMMUNITY)
Admission: RE | Admit: 2020-05-19 | Discharge: 2020-05-19 | Disposition: A | Discharge status: Home / Self Care | Payer: BC Managed Care – PPO | Attending: Internal Medicine | Admitting: Internal Medicine

## 2020-05-19 ENCOUNTER — Other Ambulatory Visit: Payer: Self-pay

## 2020-05-19 ENCOUNTER — Encounter (HOSPITAL_COMMUNITY): Payer: Self-pay | Admitting: Internal Medicine

## 2020-05-19 ENCOUNTER — Encounter (HOSPITAL_COMMUNITY)
Admission: RE | Disposition: A | Discharge status: Home / Self Care | Payer: Self-pay | Source: Home / Self Care | Attending: Internal Medicine

## 2020-05-19 DIAGNOSIS — Z1211 Encounter for screening for malignant neoplasm of colon: Secondary | ICD-10-CM | POA: Diagnosis not present

## 2020-05-19 DIAGNOSIS — K635 Polyp of colon: Secondary | ICD-10-CM

## 2020-05-19 DIAGNOSIS — D122 Benign neoplasm of ascending colon: Secondary | ICD-10-CM | POA: Diagnosis not present

## 2020-05-19 DIAGNOSIS — D124 Benign neoplasm of descending colon: Secondary | ICD-10-CM | POA: Diagnosis not present

## 2020-05-19 DIAGNOSIS — Z86010 Personal history of colonic polyps: Secondary | ICD-10-CM

## 2020-05-19 HISTORY — PX: POLYPECTOMY: SHX5525

## 2020-05-19 HISTORY — PX: COLONOSCOPY: SHX5424

## 2020-05-19 SURGERY — COLONOSCOPY
Anesthesia: Moderate Sedation

## 2020-05-19 MED ORDER — MEPERIDINE HCL 100 MG/ML IJ SOLN
INTRAMUSCULAR | Status: DC | PRN
  Administered 2020-05-19: 15 mg
  Administered 2020-05-19: 10 mg
  Administered 2020-05-19: 25 mg

## 2020-05-19 MED ORDER — ONDANSETRON HCL 4 MG/2ML IJ SOLN
INTRAMUSCULAR | Status: DC | PRN
  Administered 2020-05-19: 4 mg via INTRAVENOUS

## 2020-05-19 MED ORDER — MIDAZOLAM HCL 5 MG/5ML IJ SOLN
INTRAMUSCULAR | Status: AC
  Filled 2020-05-19: qty 10

## 2020-05-19 MED ORDER — ONDANSETRON HCL 4 MG/2ML IJ SOLN
INTRAMUSCULAR | Status: AC
  Filled 2020-05-19: qty 2

## 2020-05-19 MED ORDER — MIDAZOLAM HCL 5 MG/5ML IJ SOLN
INTRAMUSCULAR | Status: DC | PRN
  Administered 2020-05-19 (×3): 2 mg via INTRAVENOUS
  Administered 2020-05-19: 1 mg via INTRAVENOUS

## 2020-05-19 MED ORDER — SODIUM CHLORIDE 0.9 % IV SOLN
INTRAVENOUS | Status: DC
  Administered 2020-05-19: 1000 mL via INTRAVENOUS

## 2020-05-19 MED ORDER — MEPERIDINE HCL 50 MG/ML IJ SOLN
INTRAMUSCULAR | Status: AC
  Filled 2020-05-19: qty 1

## 2020-05-19 NOTE — Discharge Instructions (Signed)
Colonoscopy Discharge Instructions  Read the instructions outlined below and refer to this sheet in the next few weeks. These discharge instructions provide you with general information on caring for yourself after you leave the hospital. Your doctor may also give you specific instructions. While your treatment has been planned according to the most current medical practices available, unavoidable complications occasionally occur. If you have any problems or questions after discharge, call Dr. Gala Romney at [phone]. ACTIVITY  You may resume your regular activity, but move at a slower pace for the next 24 hours.   Take frequent rest periods for the next 24 hours.   Walking will help get rid of the air and reduce the bloated feeling in your belly (abdomen).   No driving for 24 hours (because of the medicine (anesthesia) used during the test).    Do not sign any important legal documents or operate any machinery for 24 hours (because of the anesthesia used during the test).  NUTRITION  Drink plenty of fluids.   You may resume your normal diet as instructed by your doctor.   Begin with a light meal and progress to your normal diet. Heavy or fried foods are harder to digest and may make you feel sick to your stomach (nauseated).   Avoid alcoholic beverages for 24 hours or as instructed.  MEDICATIONS  You may resume your normal medications unless your doctor tells you otherwise.  WHAT YOU CAN EXPECT TODAY  Some feelings of bloating in the abdomen.   Passage of more gas than usual.   Spotting of blood in your stool or on the toilet paper.  IF YOU HAD POLYPS REMOVED DURING THE COLONOSCOPY:  No aspirin products for 7 days or as instructed.   No alcohol for 7 days or as instructed.   Eat a soft diet for the next 24 hours.  FINDING OUT THE RESULTS OF YOUR TEST Not all test results are available during your visit. If your test results are not back during the visit, make an appointment  with your caregiver to find out the results. Do not assume everything is normal if you have not heard from your caregiver or the medical facility. It is important for you to follow up on all of your test results.  SEEK IMMEDIATE MEDICAL ATTENTION IF:  You have more than a spotting of blood in your stool.   Your belly is swollen (abdominal distention).   You are nauseated or vomiting.   You have a temperature over 101.   You have abdominal pain or discomfort that is severe or gets worse throughout the day.   2 polyps removed from your colon today  Further recommendations to follow pending review of pathology report  At patient request, I called Darlene at [phone] -and reviewed results   Colon Polyps  Polyps are tissue growths inside the body. Polyps can grow in many places, including the large intestine (colon). A polyp may be a round bump or a mushroom-shaped growth. You could have one polyp or several. Most colon polyps are noncancerous (benign). However, some colon polyps can become cancerous over time. Finding and removing the polyps early can help prevent this. What are the causes? The exact cause of colon polyps is not known. What increases the risk? You are more likely to develop this condition if you:  Have a family history of colon cancer or colon polyps.  Are older than 57 or older than 45 if you are African American.  Have inflammatory bowel  disease, such as ulcerative colitis or Crohn's disease.  Have certain hereditary conditions, such as: ? Familial adenomatous polyposis. ? Lynch syndrome. ? Turcot syndrome. ? Peutz-Jeghers syndrome.  Are overweight.  Smoke cigarettes.  Do not get enough exercise.  Drink too much alcohol.  Eat a diet that is high in fat and red meat and low in fiber.  Had childhood cancer that was treated with abdominal radiation. What are the signs or symptoms? Most polyps do not cause symptoms. If you have symptoms, they may  include:  Blood coming from your rectum when having a bowel movement.  Blood in your stool. The stool may look dark red or black.  Abdominal pain.  A change in bowel habits, such as constipation or diarrhea. How is this diagnosed? This condition is diagnosed with a colonoscopy. This is a procedure in which a lighted, flexible scope is inserted into the anus and then passed into the colon to examine the area. Polyps are sometimes found when a colonoscopy is done as part of routine cancer screening tests. How is this treated? Treatment for this condition involves removing any polyps that are found. Most polyps can be removed during a colonoscopy. Those polyps will then be tested for cancer. Additional treatment may be needed depending on the results of testing. Follow these instructions at home: Lifestyle  Maintain a healthy weight, or lose weight if recommended by your health care provider.  Exercise every day or as told by your health care provider.  Do not use any products that contain nicotine or tobacco, such as cigarettes and e-cigarettes. If you need help quitting, ask your health care provider.  If you drink alcohol, limit how much you have: ? 0-1 drink a day for women. ? 0-2 drinks a day for men.  Be aware of how much alcohol is in your drink. In the [place] one drink equals one 12 oz bottle of beer (355 mL), one 5 oz glass of wine (148 mL), or one 1 oz shot of hard liquor (44 mL). Eating and drinking   Eat foods that are high in fiber, such as fruits, vegetables, and whole grains.  Eat foods that are high in calcium and vitamin D, such as milk, cheese, yogurt, eggs, liver, fish, and broccoli.  Limit foods that are high in fat, such as fried foods and desserts.  Limit the amount of red meat and processed meat you eat, such as hot dogs, sausage, bacon, and lunch meats. General instructions  Keep all follow-up visits as told by your health care provider. This is  important. ? This includes having regularly scheduled colonoscopies. ? Talk to your health care provider about when you need a colonoscopy. Contact a health care provider if:  You have new or worsening bleeding during a bowel movement.  You have new or increased blood in your stool.  You have a change in bowel habits.  You lose weight for no known reason. Summary  Polyps are tissue growths inside the body. Polyps can grow in many places, including the colon.  Most colon polyps are noncancerous (benign), but some can become cancerous over time.  This condition is diagnosed with a colonoscopy.  Treatment for this condition involves removing any polyps that are found. Most polyps can be removed during a colonoscopy. This information is not intended to replace advice given to you by your health care provider. Make sure you discuss any questions you have with your health care provider. Document Revised: 03/05/2018 Document Reviewed: 03/05/2018  Elsevier Patient Education  2020 Elsevier Inc.   

## 2020-05-19 NOTE — Op Note (Signed)
[facility] Patient Name: Nathan Reynolds Procedure Date: 05/19/2020 9:53 AM MRN: [id number] Date of Birth: 1956-01-02 Attending MD: Norvel Richards , MD CSN: [id number] Age: 65 Admit Type: Outpatient Procedure:                Colonoscopy Indications:              High risk colon cancer surveillance: Personal                            history of colonic polyps Providers:                Norvel Richards, MD, Janeece Riggers, RN, Crystal                            Page, Nelma Rothman, Technician Referring MD:              Medicines:                Midazolam 7 mg IV, Meperidine 50 mg IV, Ondansetron                            4 mg IV Complications:            No immediate complications. Estimated Blood Loss:     Estimated blood loss was minimal. Procedure:                Pre-Anesthesia Assessment:                           - Prior to the procedure, a History and Physical                            was performed, and patient medications and                            allergies were reviewed. The patient's tolerance of                            previous anesthesia was also reviewed. The risks                            and benefits of the procedure and the sedation                            options and risks were discussed with the patient.                            All questions were answered, and informed consent                            was obtained. Prior Anticoagulants: The patient has                            taken no previous anticoagulant or antiplatelet  agents. ASA Grade Assessment: II - A patient with                            mild systemic disease. After reviewing the risks                            and benefits, the patient was deemed in                            satisfactory condition to undergo the procedure.                           After obtaining informed consent, the colonoscope                            was passed under direct  vision. Throughout the                            procedure, the patient's blood pressure, pulse, and                            oxygen saturations were monitored continuously. The                            CF-HQ190L ([id number]) scope was introduced through                            the anus and advanced to the the cecum, identified                            by appendiceal orifice and ileocecal valve. The                            colonoscopy was performed without difficulty. The                            patient tolerated the procedure well. The quality                            of the bowel preparation was adequate. Scope In: 10:08:30 AM Scope Out: 10:21:56 AM Scope Withdrawal Time: 0 hours 11 minutes 2 seconds  Total Procedure Duration: 0 hours 13 minutes 26 seconds  Findings:      The perianal and digital rectal examinations were normal.      Two sessile polyps were found in the descending colon and ascending       colon. The polyps were 5 to 6 mm in size. These polyps were removed with       a cold snare. Resection and retrieval were complete. Estimated blood       loss was minimal.      The exam was otherwise without abnormality on direct and retroflexion       views. Impression:               - Two 5 to 6 mm polyps in the descending colon and  in the ascending colon, removed with a cold snare.                            Resected and retrieved.                           - The examination was otherwise normal on direct                            and retroflexion views. Moderate Sedation:      Moderate (conscious) sedation was administered by the endoscopy nurse       and supervised by the endoscopist. The following parameters were       monitored: oxygen saturation, heart rate, blood pressure, respiratory       rate, EKG, adequacy of pulmonary ventilation, and response to care.       Total physician intraservice time was 23 minutes. Recommendation:            - Patient has a contact number available for                            emergencies. The signs and symptoms of potential                            delayed complications were discussed with the                            patient. Return to normal activities tomorrow.                            Written discharge instructions were provided to the                            patient.                           - Resume previous diet.                           - Continue present medications.                           - Repeat colonoscopy date to be determined after                            pending pathology results are reviewed for                            surveillance.                           - Return to GI office (date not yet determined). Procedure Code(s):        --- Professional ---                           [phone], Colonoscopy, flexible; with removal of  tumor(s), polyp(s), or other lesion(s) by snare                            technique                           99153, Moderate sedation; each additional 15                            minutes intraservice time                           G0500, Moderate sedation services provided by the                            same physician or other qualified health care                            professional performing a gastrointestinal                            endoscopic service that sedation supports,                            requiring the presence of an independent trained                            observer to assist in the monitoring of the                            patient's level of consciousness and physiological                            status; initial 15 minutes of intra-service time;                            patient age 97 years or older (additional time may                            be reported with [phone], as appropriate) Diagnosis Code(s):        --- Professional ---                            Z86.010, Personal history of colonic polyps                           K63.5, Polyp of colon CPT copyright 2019 American Medical Association. All rights reserved. The codes documented in this report are preliminary and upon coder review may  be revised to meet current compliance requirements. Cristopher Estimable. Donjuan Robison, MD Norvel Richards, MD 05/19/2020 10:37:40 AM This report has been signed electronically. Number of Addenda: 0

## 2020-05-19 NOTE — H&P (Addendum)
@LOGO @   Primary Care Physician:  Kathyrn Drown, MD Primary Gastroenterologist:  Dr. Gala Romney  Pre-Procedure History & Physical: HPI:  Nathan Reynolds is a 64 y.o. male here for surveillance colonoscopy.  History of multiple colonic adenomas removed 3 years ago.  No bowel symptoms currently.  Past Medical History:  Diagnosis Date  . Hyperglycemia     Past Surgical History:  Procedure Laterality Date  . COLONOSCOPY    . COLONOSCOPY N/A 02/14/2017   Procedure: COLONOSCOPY;  Surgeon: Daneil Dolin, MD;  Location: [facility];  Service: Endoscopy;  Laterality: N/A;  7:30 AM  . POLYPECTOMY  02/14/2017   Procedure: POLYPECTOMY;  Surgeon: Daneil Dolin, MD;  Location: [facility];  Service: Endoscopy;;  cecal, ascending, decending    Prior to Admission medications   Medication Sig Start Date End Date Taking? Authorizing Provider  BIOTIN PO Take 1 tablet by mouth daily.    Yes [provider]  Cholecalciferol (VITAMIN D3) 5000 units CAPS Take 5,000 Units by mouth daily.   Yes [provider]  fish oil-omega-3 fatty acids 1000 MG capsule Take 2 g by mouth daily.   Yes [provider]  Multiple Vitamin (MULTIVITAMIN) tablet Take 1 tablet by mouth daily.   Yes [provider]  ibuprofen (ADVIL) 800 MG tablet TAKE 1 TABLET BY MOUTH EVERY EIGHT HOURS AS NEEDED. Patient taking differently: Take 800 mg by mouth every 8 (eight) hours as needed for moderate pain. TAKE 1 TABLET BY MOUTH EVERY EIGHT HOURS AS NEEDED. 12/30/19   Kathyrn Drown, MD    Allergies as of 04/10/2020  . (No Known Allergies)    Family History  Problem Relation Age of Onset  . Diabetes Father     Social History   Socioeconomic History  . Marital status: Married    Spouse name: Not on file  . Number of children: Not on file  . Years of education: Not on file  . Highest education level: Not on file  Occupational History  . Not on file  Tobacco Use  . Smoking status: Never  Smoker  . Smokeless tobacco: Never Used  Vaping Use  . Vaping Use: Never used  Substance and Sexual Activity  . Alcohol use: No  . Drug use: No  . Sexual activity: Not on file  Other Topics Concern  . Not on file  Social History Narrative  . Not on file   Social Determinants of Health   Financial Resource Strain:   . Difficulty of Paying Living Expenses:   Food Insecurity:   . Worried About Charity fundraiser in the Last Year:   . Arboriculturist in the Last Year:   Transportation Needs:   . Film/video editor (Medical):   Marland Kitchen Lack of Transportation (Non-Medical):   Physical Activity:   . Days of Exercise per Week:   . Minutes of Exercise per Session:   Stress:   . Feeling of Stress :   Social Connections:   . Frequency of Communication with Friends and Family:   . Frequency of Social Gatherings with Friends and Family:   . Attends Religious Services:   . Active Member of Clubs or Organizations:   . Attends Archivist Meetings:   Marland Kitchen Marital Status:   Intimate Partner Violence:   . Fear of Current or Ex-Partner:   . Emotionally Abused:   Marland Kitchen Physically Abused:   . Sexually Abused:     Review  of Systems: See HPI, otherwise negative ROS  Physical Exam: BP 135/80   Pulse 73   Temp 98.1 F (36.7 C) (Oral)   Resp 12   Ht 5\' 8"  (1.727 m)   Wt 83.9 kg   SpO2 98%   BMI 28.13 kg/m  General:   Alert,  Well-developed, well-nourished, pleasant and cooperative in NAD ant cervical adenopathy. Lungs:  Clear throughout to auscultation.   No wheezes, crackles, or rhonchi. No acute distress. Heart:  Regular rate and rhythm; no murmurs, clicks, rubs,  or gallops. Abdomen: Non-distended, normal bowel sounds.  Soft and nontender without appreciable mass or hepatosplenomegaly.  Pulses:  Normal pulses noted. Extremities:  Without clubbing or edema.  Impression/Plan: 64 year old gentleman with history of multiple colonic adenomas removed previously.  Here for  surveillance colonoscopy per plan.  The risks, benefits, limitations, alternatives and imponderables have been reviewed with the patient. Questions have been answered. All parties are agreeable.      Notice: This dictation was prepared with Dragon dictation along with smaller phrase technology. Any transcriptional errors that result from this process are unintentional and may not be corrected upon review.

## 2020-05-19 NOTE — OR Nursing (Signed)
Nathan Reynolds had a procedure at [facility] on 05/19/20 and cannot return to work until noon on 05/20/20.

## 2020-05-22 ENCOUNTER — Encounter: Payer: Self-pay | Admitting: Internal Medicine

## 2020-05-22 LAB — SURGICAL PATHOLOGY

## 2020-05-24 ENCOUNTER — Encounter (HOSPITAL_COMMUNITY): Payer: Self-pay | Admitting: Internal Medicine

## 2020-07-09 ENCOUNTER — Other Ambulatory Visit: Payer: Self-pay | Admitting: Family Medicine

## 2020-07-09 DIAGNOSIS — Z125 Encounter for screening for malignant neoplasm of prostate: Secondary | ICD-10-CM

## 2020-07-09 DIAGNOSIS — Z79899 Other long term (current) drug therapy: Secondary | ICD-10-CM

## 2020-07-09 DIAGNOSIS — E785 Hyperlipidemia, unspecified: Secondary | ICD-10-CM

## 2020-07-09 DIAGNOSIS — R5383 Other fatigue: Secondary | ICD-10-CM

## 2020-07-10 NOTE — Telephone Encounter (Signed)
No vm set up. 

## 2020-07-11 NOTE — Telephone Encounter (Signed)
No vm set up. 

## 2020-07-11 NOTE — Telephone Encounter (Signed)
Patient scheduled 9/10 for medication follow up

## 2020-08-11 ENCOUNTER — Ambulatory Visit: Payer: BC Managed Care – PPO | Admitting: Family Medicine

## 2020-08-11 ENCOUNTER — Other Ambulatory Visit: Payer: Self-pay

## 2020-08-11 VITALS — BP 130/72 | Temp 97.5°F | Ht 67.0 in | Wt 195.8 lb

## 2020-08-11 DIAGNOSIS — L2389 Allergic contact dermatitis due to other agents: Secondary | ICD-10-CM | POA: Diagnosis not present

## 2020-08-11 DIAGNOSIS — M5432 Sciatica, left side: Secondary | ICD-10-CM | POA: Diagnosis not present

## 2020-08-11 MED ORDER — IBUPROFEN 800 MG PO TABS: ORAL_TABLET | ORAL | 4 refills | Status: DC

## 2020-08-11 NOTE — Progress Notes (Signed)
   Subjective:    Patient ID: Nathan Reynolds, male    DOB: 1956/10/20, 64 y.o.   MRN: [id number]  HPI  Patient arrives for a follow up on sciatica in left hip. Patient needs refill of Advil. Patient needs a note for work stating he cant be around insulation at work. Patient states when he is around insulation he itches a lot on the arms itches a lot on his hands he states he is wearing a mask so therefore he does not getting the lung issues with it.  He also has a lot of pain discomfort in the left hip and into the left leg. Review of Systems  Constitutional: Negative for activity change.  HENT: Negative for congestion and rhinorrhea.   Respiratory: Negative for cough and shortness of breath.   Cardiovascular: Negative for chest pain.  Gastrointestinal: Negative for abdominal pain, diarrhea, nausea and vomiting.  Genitourinary: Negative for dysuria and hematuria.  Neurological: Negative for weakness and headaches.  Psychiatric/Behavioral: Negative for behavioral problems and confusion.       Objective:   Physical Exam Constitutional:      General: He is not in acute distress.    Appearance: He is well-developed.  HENT:     Head: Normocephalic.  Cardiovascular:     Rate and Rhythm: Normal rate and regular rhythm.     Heart sounds: Normal heart sounds. No murmur heard.   Pulmonary:     Effort: Pulmonary effort is normal.     Breath sounds: Normal breath sounds.  Skin:    General: Skin is warm and dry.  Neurological:     Mental Status: He is alert.  Psychiatric:        Behavior: Behavior normal.      Negative straight leg raise.  Low back nontender to palpation     Assessment & Plan:  Probable workplace allergy to insulation/irritation with insulation-very important for the patient to progress forward with staying away from insulation as best as possible  Intermittent lower leg left leg pain and low back pain recommend ibuprofen when necessary use and frequently stretches  recommended on a regular basis follow-up at wellness checkup  It is possible this could be pisiform is inflammation

## 2020-11-23 ENCOUNTER — Ambulatory Visit: Payer: BC Managed Care – PPO | Attending: Internal Medicine

## 2020-11-23 ENCOUNTER — Ambulatory Visit: Payer: BC Managed Care – PPO

## 2020-11-23 DIAGNOSIS — Z23 Encounter for immunization: Secondary | ICD-10-CM

## 2020-11-23 NOTE — Progress Notes (Signed)
   Covid-19 Vaccination [facility]  Name:  Nathan Reynolds    MRN: [id number] DOB: October 21, 1956  11/23/2020  [name] was observed post Covid-19 immunization for 15 minutes without incident. He was provided with Vaccine Information Sheet and instruction to access the V-Safe system.   [name] was instructed to call 911 with any severe reactions post vaccine: Marland Kitchen Difficulty breathing  . Swelling of face and throat  . A fast heartbeat  . A bad rash all over body  . Dizziness and weakness   Immunizations Administered    Name Date Dose VIS Date Route   Pfizer COVID-19 Vaccine 11/23/2020  4:02 PM 0.3 mL 09/20/2020 Intramuscular   Manufacturer: [employer]   Lot: [id number]   NDC: [address]-6

## 2021-01-08 ENCOUNTER — Telehealth: Payer: Self-pay | Admitting: Family Medicine

## 2021-01-08 DIAGNOSIS — Z125 Encounter for screening for malignant neoplasm of prostate: Secondary | ICD-10-CM

## 2021-01-08 DIAGNOSIS — Z130 Encounter for screening for diseases of the blood and blood-forming organs and certain disorders involving the immune mechanism: Secondary | ICD-10-CM

## 2021-01-08 DIAGNOSIS — Z13220 Encounter for screening for lipoid disorders: Secondary | ICD-10-CM

## 2021-01-08 DIAGNOSIS — Z131 Encounter for screening for diabetes mellitus: Secondary | ICD-10-CM

## 2021-01-08 NOTE — Telephone Encounter (Signed)
Blood work ordered in Epic. Patient notified. 

## 2021-01-08 NOTE — Telephone Encounter (Signed)
Patient has a physical on 2/11 and needing labs done.

## 2021-01-08 NOTE — Telephone Encounter (Signed)
Lipid, CMP, CBC, PSA  His last vitamin D level was good I would not recommend that unless the patient desires to have vitamin D ordered

## 2021-01-10 DIAGNOSIS — Z13220 Encounter for screening for lipoid disorders: Secondary | ICD-10-CM | POA: Diagnosis not present

## 2021-01-10 DIAGNOSIS — Z131 Encounter for screening for diabetes mellitus: Secondary | ICD-10-CM | POA: Diagnosis not present

## 2021-01-10 DIAGNOSIS — Z130 Encounter for screening for diseases of the blood and blood-forming organs and certain disorders involving the immune mechanism: Secondary | ICD-10-CM | POA: Diagnosis not present

## 2021-01-10 DIAGNOSIS — Z125 Encounter for screening for malignant neoplasm of prostate: Secondary | ICD-10-CM | POA: Diagnosis not present

## 2021-01-11 LAB — COMPREHENSIVE METABOLIC PANEL
ALT: 24 IU/L (ref 0–44)
AST: 25 IU/L (ref 0–40)
Albumin/Globulin Ratio: 1.7 (ref 1.2–2.2)
Albumin: 4.6 g/dL (ref 3.8–4.8)
Alkaline Phosphatase: 104 IU/L (ref 44–121)
BUN/Creatinine Ratio: 14 (ref 10–24)
BUN: 16 mg/dL (ref 8–27)
Bilirubin Total: 0.5 mg/dL (ref 0.0–1.2)
CO2: 22 mmol/L (ref 20–29)
Calcium: 9.8 mg/dL (ref 8.6–10.2)
Chloride: 102 mmol/L (ref 96–106)
Creatinine, Ser: 1.12 mg/dL (ref 0.76–1.27)
GFR calc Af Amer: 80 mL/min/{1.73_m2} (ref >59)
GFR calc non Af Amer: 69 mL/min/{1.73_m2} (ref >59)
Globulin, Total: 2.7 g/dL (ref 1.5–4.5)
Glucose: 89 mg/dL (ref 65–99)
Potassium: 4.6 mmol/L (ref 3.5–5.2)
Sodium: 140 mmol/L (ref 134–144)
Total Protein: 7.3 g/dL (ref 6.0–8.5)

## 2021-01-11 LAB — CBC WITH DIFFERENTIAL/PLATELET
Basophils Absolute: 0 10*3/uL (ref 0.0–0.2)
Basos: 1 %
EOS (ABSOLUTE): 0.1 10*3/uL (ref 0.0–0.4)
Eos: 2 %
Hematocrit: 44 % (ref 37.5–51.0)
Hemoglobin: 15.2 g/dL (ref 13.0–17.7)
Immature Grans (Abs): 0 10*3/uL (ref 0.0–0.1)
Immature Granulocytes: 0 %
Lymphocytes Absolute: 2.1 10*3/uL (ref 0.7–3.1)
Lymphs: 42 %
MCH: 30.8 pg (ref 26.6–33.0)
MCHC: 34.5 g/dL (ref 31.5–35.7)
MCV: 89 fL (ref 79–97)
Monocytes Absolute: 0.4 10*3/uL (ref 0.1–0.9)
Monocytes: 9 %
Neutrophils Absolute: 2.4 10*3/uL (ref 1.4–7.0)
Neutrophils: 46 %
Platelets: 184 10*3/uL (ref 150–450)
RBC: 4.93 x10E6/uL (ref 4.14–5.80)
RDW: 11.8 % (ref 11.6–15.4)
WBC: 5 10*3/uL (ref 3.4–10.8)

## 2021-01-11 LAB — LIPID PANEL
Chol/HDL Ratio: 3.3 ratio (ref 0.0–5.0)
Cholesterol, Total: 193 mg/dL (ref 100–199)
HDL: 59 mg/dL (ref >39)
LDL Chol Calc (NIH): 123 mg/dL — ABNORMAL HIGH (ref 0–99)
Triglycerides: 59 mg/dL (ref 0–149)
VLDL Cholesterol Cal: 11 mg/dL (ref 5–40)

## 2021-01-11 LAB — PSA: Prostate Specific Ag, Serum: 3.6 ng/mL (ref 0.0–4.0)

## 2021-01-12 ENCOUNTER — Encounter: Payer: Self-pay | Admitting: Family Medicine

## 2021-01-12 ENCOUNTER — Other Ambulatory Visit: Payer: Self-pay

## 2021-01-12 ENCOUNTER — Ambulatory Visit (INDEPENDENT_AMBULATORY_CARE_PROVIDER_SITE_OTHER): Payer: BC Managed Care – PPO | Admitting: Family Medicine

## 2021-01-12 VITALS — BP 128/84 | HR 69 | Temp 96.3°F | Ht 67.0 in | Wt 190.0 lb

## 2021-01-12 DIAGNOSIS — Z0000 Encounter for general adult medical examination without abnormal findings: Secondary | ICD-10-CM

## 2021-01-12 DIAGNOSIS — N400 Enlarged prostate without lower urinary tract symptoms: Secondary | ICD-10-CM | POA: Diagnosis not present

## 2021-01-12 MED ORDER — IBUPROFEN 600 MG PO TABS: ORAL_TABLET | ORAL | 3 refills | Status: DC

## 2021-01-12 NOTE — Patient Instructions (Addendum)
Repeat cholesterol in July Results for orders placed or performed in visit on 01/08/21  Lipid panel  Result Value Ref Range   Cholesterol, Total 193 100 - 199 mg/dL   Triglycerides 59 0 - 149 mg/dL   HDL 59 >39 mg/dL   VLDL Cholesterol Cal 11 5 - 40 mg/dL   LDL Chol Calc (NIH) 123 (H) 0 - 99 mg/dL   Chol/HDL Ratio 3.3 0.0 - 5.0 ratio  Comprehensive metabolic panel  Result Value Ref Range   Glucose 89 65 - 99 mg/dL   BUN 16 8 - 27 mg/dL   Creatinine, Ser 1.12 0.76 - 1.27 mg/dL   GFR calc non Af Amer 69 >59 mL/min/1.73   GFR calc Af Amer 80 >59 mL/min/1.73   BUN/Creatinine Ratio 14 10 - 24   Sodium 140 134 - 144 mmol/L   Potassium 4.6 3.5 - 5.2 mmol/L   Chloride 102 96 - 106 mmol/L   CO2 22 20 - 29 mmol/L   Calcium 9.8 8.6 - 10.2 mg/dL   Total Protein 7.3 6.0 - 8.5 g/dL   Albumin 4.6 3.8 - 4.8 g/dL   Globulin, Total 2.7 1.5 - 4.5 g/dL   Albumin/Globulin Ratio 1.7 1.2 - 2.2   Bilirubin Total 0.5 0.0 - 1.2 mg/dL   Alkaline Phosphatase 104 44 - 121 IU/L   AST 25 0 - 40 IU/L   ALT 24 0 - 44 IU/L  CBC with Differential/Platelet  Result Value Ref Range   WBC 5.0 3.4 - 10.8 x10E3/uL   RBC 4.93 4.14 - 5.80 x10E6/uL   Hemoglobin 15.2 13.0 - 17.7 g/dL   Hematocrit 44.0 37.5 - 51.0 %   MCV 89 79 - 97 fL   MCH 30.8 26.6 - 33.0 pg   MCHC 34.5 31.5 - 35.7 g/dL   RDW 11.8 11.6 - 15.4 %   Platelets 184 150 - 450 x10E3/uL   Neutrophils 46 Not Estab. %   Lymphs 42 Not Estab. %   Monocytes 9 Not Estab. %   Eos 2 Not Estab. %   Basos 1 Not Estab. %   Neutrophils Absolute 2.4 1.4 - 7.0 x10E3/uL   Lymphocytes Absolute 2.1 0.7 - 3.1 x10E3/uL   Monocytes Absolute 0.4 0.1 - 0.9 x10E3/uL   EOS (ABSOLUTE) 0.1 0.0 - 0.4 x10E3/uL   Basophils Absolute 0.0 0.0 - 0.2 x10E3/uL   Immature Granulocytes 0 Not Estab. %   Immature Grans (Abs) 0.0 0.0 - 0.1 x10E3/uL  PSA  Result Value Ref Range   Prostate Specific Ag, Serum 3.6 0.0 - 4.0 ng/mL     We will do referral to Urologist at [facility] [place] office  High Cholesterol  High cholesterol is a condition in which the blood has high levels of a white, waxy substance similar to fat (cholesterol). The liver makes all the cholesterol that the body needs. The human body needs small amounts of cholesterol to help build cells. A person gets extra or excess cholesterol from the food that he or she eats. The blood carries cholesterol from the liver to the rest of the body. If you have high cholesterol, deposits (plaques) may build up on the walls of your arteries. Arteries are the blood vessels that carry blood away from your heart. These plaques make the arteries narrow and stiff. Cholesterol plaques increase your risk for heart attack and stroke. Work with your health care provider to keep your cholesterol levels in a healthy range. What increases the risk? The following  factors may make you more likely to develop this condition:  Eating foods that are high in animal fat (saturated fat) or cholesterol.  Being overweight.  Not getting enough exercise.  A family history of high cholesterol (familial hypercholesterolemia).  Use of tobacco products.  Having diabetes. What are the signs or symptoms? There are no symptoms of this condition. How is this diagnosed? This condition may be diagnosed based on the results of a blood test.  If you are older than 65 years of age, your health care provider may check your cholesterol levels every 4-6 years.  You may be checked more often if you have high cholesterol or other risk factors for heart disease. The blood test for cholesterol measures:  "Bad" cholesterol, or LDL cholesterol. This is the main type of cholesterol that causes heart disease. The desired level is less than 100 mg/dL.  "Good" cholesterol, or HDL cholesterol. HDL helps protect against heart disease by cleaning the arteries and carrying the LDL to the liver for processing. The desired level for HDL is 60 mg/dL  or higher.  Triglycerides. These are fats that your body can store or burn for energy. The desired level is less than 150 mg/dL.  Total cholesterol. This measures the total amount of cholesterol in your blood and includes LDL, HDL, and triglycerides. The desired level is less than 200 mg/dL. How is this treated? This condition may be treated with:  Diet changes. You may be asked to eat foods that have more fiber and less saturated fats or added sugar.  Lifestyle changes. These may include regular exercise, maintaining a healthy weight, and quitting use of tobacco products.  Medicines. These are given when diet and lifestyle changes have not worked. You may be prescribed a statin medicine to help lower your cholesterol levels. Follow these instructions at home: Eating and drinking  Eat a healthy, balanced diet. This diet includes: ? Daily servings of a variety of fresh, frozen, or canned fruits and vegetables. ? Daily servings of whole grain foods that are rich in fiber. ? Foods that are low in saturated fats and trans fats. These include poultry and fish without skin, lean cuts of meat, and low-fat dairy products. ? A variety of fish, especially oily fish that contain omega-3 fatty acids. Aim to eat fish at least 2 times a week.  Avoid foods and drinks that have added sugar.  Use healthy cooking methods, such as roasting, grilling, broiling, baking, poaching, steaming, and stir-frying. Do not fry your food except for stir-frying.   Lifestyle  Get regular exercise. Aim to exercise for a total of 150 minutes a week. Increase your activity level by doing activities such as gardening, walking, and taking the stairs.  Do not use any products that contain nicotine or tobacco, such as cigarettes, e-cigarettes, and chewing tobacco. If you need help quitting, ask your health care provider.   General instructions  Take over-the-counter and prescription medicines only as told by your health care  provider.  Keep all follow-up visits as told by your health care provider. This is important. Where to find more information  American Heart Association: www.heart.org  National Heart, Lung, and Blood Institute: https://wilson-eaton.com/ Contact a health care provider if:  You have trouble achieving or maintaining a healthy diet or weight.  You are starting an exercise program.  You are unable to stop smoking. Get help right away if:  You have chest pain.  You have trouble breathing.  You have any symptoms of  a stroke. "BE FAST" is an easy way to remember the main warning signs of a stroke: ? B - Balance. Signs are dizziness, sudden trouble walking, or loss of balance. ? E - Eyes. Signs are trouble seeing or a sudden change in vision. ? F - Face. Signs are sudden weakness or numbness of the face, or the face or eyelid drooping on one side. ? A - Arms. Signs are weakness or numbness in an arm. This happens suddenly and usually on one side of the body. ? S - Speech. Signs are sudden trouble speaking, slurred speech, or trouble understanding what people say. ? T - Time. Time to call emergency services. Write down what time symptoms started.  You have other signs of a stroke, such as: ? A sudden, severe headache with no known cause. ? Nausea or vomiting. ? Seizure. These symptoms may represent a serious problem that is an emergency. Do not wait to see if the symptoms will go away. Get medical help right away. Call your local emergency services (911 in the [place] Do not drive yourself to the hospital. Summary  Cholesterol plaques increase your risk for heart attack and stroke. Work with your health care provider to keep your cholesterol levels in a healthy range.  Eat a healthy, balanced diet, get regular exercise, and maintain a healthy weight.  Do not use any products that contain nicotine or tobacco, such as cigarettes, e-cigarettes, and chewing tobacco.  Get help right away if you  have any symptoms of a stroke. This information is not intended to replace advice given to you by your health care provider. Make sure you discuss any questions you have with your health care provider. Document Revised: 10/18/2019 Document Reviewed: 10/18/2019 Elsevier Patient Education  2021 [employer].

## 2021-01-12 NOTE — Progress Notes (Signed)
Added to reminder file ?

## 2021-01-12 NOTE — Progress Notes (Signed)
Subjective:    Patient ID: Nathan Reynolds, male    DOB: [birth year], 65 y.o.   MRN: [id number]  HPI The patient comes in today for a wellness visit.  Patient overall trying to eat healthy states he could be doing a better job eating healthier.  Has had more fats in his diet recently cholesterol is mildly elevated we did discuss this in detail  A review of their health history was completed.  A review of medications was also completed.  Any needed refills; ibuprofen 800mg   Eating habits: health conscious  Falls/  MVA accidents in past few months: none  Regular exercise: none  Specialist pt sees on regular basis: none  Preventative health issues were discussed.   Additional concerns: none    Review of Systems  Constitutional: Negative for activity change, appetite change and fever.  HENT: Negative for congestion and rhinorrhea.   Eyes: Negative for discharge.  Respiratory: Negative for cough and wheezing.   Cardiovascular: Negative for chest pain.  Gastrointestinal: Negative for abdominal pain, blood in stool and vomiting.  Genitourinary: Negative for difficulty urinating and frequency.  Musculoskeletal: Negative for neck pain.  Skin: Negative for rash.  Allergic/Immunologic: Negative for environmental allergies and food allergies.  Neurological: Negative for weakness and headaches.  Psychiatric/Behavioral: Negative for agitation.       Objective:   Physical Exam Constitutional:      Appearance: He is well-developed and well-nourished.  HENT:     Head: Normocephalic and atraumatic.     Right Ear: External ear normal.     Left Ear: External ear normal.     Nose: Nose normal.     Mouth/Throat:     Mouth: Oropharynx is clear and moist.  Eyes:     Extraocular Movements: EOM normal.     Pupils: Pupils are equal, round, and reactive to light.  Neck:     Thyroid: No thyromegaly.  Cardiovascular:     Rate and Rhythm: Normal rate and regular rhythm.     Heart sounds:  Normal heart sounds. No murmur heard.   Pulmonary:     Effort: Pulmonary effort is normal. No respiratory distress.     Breath sounds: Normal breath sounds. No wheezing.  Abdominal:     General: Bowel sounds are normal. There is no distension.     Palpations: Abdomen is soft. There is no mass.     Tenderness: There is no abdominal tenderness.  Genitourinary:    Penis: Normal.   Musculoskeletal:        General: No edema. Normal range of motion.     Cervical back: Normal range of motion and neck supple.  Lymphadenopathy:     Cervical: No cervical adenopathy.  Skin:    General: Skin is warm and dry.     Findings: No erythema.  Neurological:     Mental Status: He is alert.     Motor: No abnormal muscle tone.  Psychiatric:        Mood and Affect: Mood and affect normal.        Behavior: Behavior normal.        Judgment: Judgment normal.   lip bump soft Has stayed the same for the past ytear Results for orders placed or performed in visit on 01/08/21  Lipid panel  Result Value Ref Range   Cholesterol, Total 193 100 - 199 mg/dL   Triglycerides 59 0 - 149 mg/dL   HDL 59 >39 mg/dL   VLDL Cholesterol Cal 11  5 - 40 mg/dL   LDL Chol Calc (NIH) 123 (H) 0 - 99 mg/dL   Chol/HDL Ratio 3.3 0.0 - 5.0 ratio  Comprehensive metabolic panel  Result Value Ref Range   Glucose 89 65 - 99 mg/dL   BUN 16 8 - 27 mg/dL   Creatinine, Ser 1.12 0.76 - 1.27 mg/dL   GFR calc non Af Amer 69 >59 mL/min/1.73   GFR calc Af Amer 80 >59 mL/min/1.73   BUN/Creatinine Ratio 14 10 - 24   Sodium 140 134 - 144 mmol/L   Potassium 4.6 3.5 - 5.2 mmol/L   Chloride 102 96 - 106 mmol/L   CO2 22 20 - 29 mmol/L   Calcium 9.8 8.6 - 10.2 mg/dL   Total Protein 7.3 6.0 - 8.5 g/dL   Albumin 4.6 3.8 - 4.8 g/dL   Globulin, Total 2.7 1.5 - 4.5 g/dL   Albumin/Globulin Ratio 1.7 1.2 - 2.2   Bilirubin Total 0.5 0.0 - 1.2 mg/dL   Alkaline Phosphatase 104 44 - 121 IU/L   AST 25 0 - 40 IU/L   ALT 24 0 - 44 IU/L  CBC with  Differential/Platelet  Result Value Ref Range   WBC 5.0 3.4 - 10.8 x10E3/uL   RBC 4.93 4.14 - 5.80 x10E6/uL   Hemoglobin 15.2 13.0 - 17.7 g/dL   Hematocrit 44.0 37.5 - 51.0 %   MCV 89 79 - 97 fL   MCH 30.8 26.6 - 33.0 pg   MCHC 34.5 31.5 - 35.7 g/dL   RDW 11.8 11.6 - 15.4 %   Platelets 184 150 - 450 x10E3/uL   Neutrophils 46 Not Estab. %   Lymphs 42 Not Estab. %   Monocytes 9 Not Estab. %   Eos 2 Not Estab. %   Basos 1 Not Estab. %   Neutrophils Absolute 2.4 1.4 - 7.0 x10E3/uL   Lymphocytes Absolute 2.1 0.7 - 3.1 x10E3/uL   Monocytes Absolute 0.4 0.1 - 0.9 x10E3/uL   EOS (ABSOLUTE) 0.1 0.0 - 0.4 x10E3/uL   Basophils Absolute 0.0 0.0 - 0.2 x10E3/uL   Immature Granulocytes 0 Not Estab. %   Immature Grans (Abs) 0.0 0.0 - 0.1 x10E3/uL  PSA  Result Value Ref Range   Prostate Specific Ag, Serum 3.6 0.0 - 4.0 ng/mL    Prostate is soft on the left side and mid area of firm area on the right side mildly enlarged      Assessment & Plan:  Abnormal prostate exam referral to urology could be BPH but need to make sure that he is not developing a prostate nodule PSA not above 4 but has been rising over the past few years  Adult wellness-complete.wellness physical was conducted today. Importance of diet and exercise were discussed in detail.  In addition to this a discussion regarding safety was also covered. We also reviewed over immunizations and gave recommendations regarding current immunization needed for age.  In addition to this additional areas were also touched on including: Preventative health exams needed:  For his ibuprofen I recommend he utilizes 600 mg ibuprofen to lessen the risk of kidney strain  Colonoscopy up-to-date next 12/21/2024  Patient was advised yearly wellness exam Patient will do cholesterol profile in the summer may need to be on a statin currently patient does not want to go on a statin

## 2021-05-09 ENCOUNTER — Telehealth: Payer: Self-pay | Admitting: Family Medicine

## 2021-05-09 ENCOUNTER — Other Ambulatory Visit: Payer: Self-pay | Admitting: Family Medicine

## 2021-05-09 DIAGNOSIS — Z13220 Encounter for screening for lipoid disorders: Secondary | ICD-10-CM

## 2021-05-09 NOTE — Telephone Encounter (Signed)
From reminder file Dairl Ponder, RN  [facility] Lipid panel due in July 2022  Lipid order placed and mailed to patient.

## 2021-05-09 NOTE — Progress Notes (Unsigned)
error 

## 2021-06-26 DIAGNOSIS — Z13220 Encounter for screening for lipoid disorders: Secondary | ICD-10-CM | POA: Diagnosis not present

## 2021-06-27 LAB — LIPID PANEL
Chol/HDL Ratio: 4.1 ratio (ref 0.0–5.0)
Cholesterol, Total: 195 mg/dL (ref 100–199)
HDL: 47 mg/dL (ref >39)
LDL Chol Calc (NIH): 130 mg/dL — ABNORMAL HIGH (ref 0–99)
Triglycerides: 101 mg/dL (ref 0–149)
VLDL Cholesterol Cal: 18 mg/dL (ref 5–40)

## 2021-07-25 ENCOUNTER — Telehealth: Payer: Self-pay | Admitting: *Deleted

## 2021-07-25 NOTE — Chronic Care Management (AMB) (Signed)
  Care Management   Note  07/25/2021 Name: MILLAGE PACE MRN: [id number] DOB: 29-Oct-1956  MAZEN PHO is a 65 y.o. year old male who is a primary care patient of Luking, Elayne Snare, MD. I reached out to Lin Landsman by phone today in response to a referral sent by Mr. Aydin Bano Jimmerson's PCP, Kathyrn Drown, MD.    Mr. Mosbrucker was given information about care management services today including:  Care management services include personalized support from designated clinical staff supervised by his physician, including individualized plan of care and coordination with other care providers 24/7 contact phone numbers for assistance for urgent and routine care needs. The patient may stop care management services at any time by phone call to the office staff.  Patient agreed to services and verbal consent obtained.   Follow up plan: Telephone appointment with care management team member scheduled for:08/15/21  [facility]  Direct Dial: [phone]

## 2021-08-15 ENCOUNTER — Ambulatory Visit: Payer: BC Managed Care – PPO | Admitting: *Deleted

## 2021-08-15 NOTE — Chronic Care Management (AMB) (Signed)
   08/15/2021  Nathan Reynolds [birth year] [id number]   Telephone call to patient for initial telephone outreach, spoke with patient who reports he does not have any medical diagnosis that would warrant a call from RN care manager.  Pt reports he is managing well and no needs for care management at this time.  Jacqlyn Larsen [facility], BSN RN Case Manager [facility] [phone]

## 2021-12-27 ENCOUNTER — Telehealth: Payer: Self-pay | Admitting: Family Medicine

## 2021-12-27 DIAGNOSIS — Z13220 Encounter for screening for lipoid disorders: Secondary | ICD-10-CM

## 2021-12-27 DIAGNOSIS — Z125 Encounter for screening for malignant neoplasm of prostate: Secondary | ICD-10-CM

## 2021-12-27 DIAGNOSIS — Z130 Encounter for screening for diseases of the blood and blood-forming organs and certain disorders involving the immune mechanism: Secondary | ICD-10-CM

## 2021-12-27 DIAGNOSIS — Z0000 Encounter for general adult medical examination without abnormal findings: Secondary | ICD-10-CM

## 2021-12-27 DIAGNOSIS — Z131 Encounter for screening for diabetes mellitus: Secondary | ICD-10-CM

## 2021-12-27 DIAGNOSIS — Z1211 Encounter for screening for malignant neoplasm of colon: Secondary | ICD-10-CM

## 2021-12-27 NOTE — Telephone Encounter (Signed)
Patient is requesting labs for physical in April

## 2021-12-27 NOTE — Telephone Encounter (Signed)
Lipid, liver, metabolic 7, CBC, PSA

## 2021-12-28 NOTE — Telephone Encounter (Signed)
It is not recommended to do blood test to screen for pancreatic cancer.  I would recommend discussion with the patient when he comes in about this.  I do not recommend testing right at the moment.  We can discuss further when he follows up for his visit If he would like to do a visit sooner to discuss further we will be happy to see him sooner for this particular issue thank you

## 2021-12-28 NOTE — Telephone Encounter (Signed)
Patient informed labs have ordered. Verbalized understanding

## 2021-12-28 NOTE — Telephone Encounter (Signed)
Labs ordered.

## 2021-12-28 NOTE — Telephone Encounter (Signed)
Voicemail not set up.

## 2021-12-28 NOTE — Telephone Encounter (Signed)
Patient notified and verbalized understanding. 

## 2022-01-01 DIAGNOSIS — H5203 Hypermetropia, bilateral: Secondary | ICD-10-CM | POA: Diagnosis not present

## 2022-03-06 ENCOUNTER — Encounter: Payer: Self-pay | Admitting: Family Medicine

## 2022-03-07 ENCOUNTER — Encounter: Payer: Self-pay | Admitting: Family Medicine

## 2022-04-05 DIAGNOSIS — Z131 Encounter for screening for diabetes mellitus: Secondary | ICD-10-CM | POA: Diagnosis not present

## 2022-04-05 DIAGNOSIS — Z125 Encounter for screening for malignant neoplasm of prostate: Secondary | ICD-10-CM | POA: Diagnosis not present

## 2022-04-05 DIAGNOSIS — Z130 Encounter for screening for diseases of the blood and blood-forming organs and certain disorders involving the immune mechanism: Secondary | ICD-10-CM | POA: Diagnosis not present

## 2022-04-05 DIAGNOSIS — Z13220 Encounter for screening for lipoid disorders: Secondary | ICD-10-CM | POA: Diagnosis not present

## 2022-04-06 LAB — BASIC METABOLIC PANEL
BUN/Creatinine Ratio: 19 (ref 10–24)
BUN: 20 mg/dL (ref 8–27)
CO2: 22 mmol/L (ref 20–29)
Calcium: 9.6 mg/dL (ref 8.6–10.2)
Chloride: 102 mmol/L (ref 96–106)
Creatinine, Ser: 1.07 mg/dL (ref 0.76–1.27)
Glucose: 91 mg/dL (ref 70–99)
Potassium: 4.4 mmol/L (ref 3.5–5.2)
Sodium: 139 mmol/L (ref 134–144)
eGFR: 77 mL/min/{1.73_m2} (ref >59)

## 2022-04-06 LAB — CBC WITH DIFFERENTIAL/PLATELET
Basophils Absolute: 0.1 10*3/uL (ref 0.0–0.2)
Basos: 2 %
EOS (ABSOLUTE): 0.1 10*3/uL (ref 0.0–0.4)
Eos: 2 %
Hematocrit: 46.4 % (ref 37.5–51.0)
Hemoglobin: 15.7 g/dL (ref 13.0–17.7)
Immature Grans (Abs): 0 10*3/uL (ref 0.0–0.1)
Immature Granulocytes: 0 %
Lymphocytes Absolute: 1.6 10*3/uL (ref 0.7–3.1)
Lymphs: 50 %
MCH: 31.3 pg (ref 26.6–33.0)
MCHC: 33.8 g/dL (ref 31.5–35.7)
MCV: 92 fL (ref 79–97)
Monocytes Absolute: 0.3 10*3/uL (ref 0.1–0.9)
Monocytes: 9 %
Neutrophils Absolute: 1.1 10*3/uL — ABNORMAL LOW (ref 1.4–7.0)
Neutrophils: 37 %
Platelets: 167 10*3/uL (ref 150–450)
RBC: 5.02 x10E6/uL (ref 4.14–5.80)
RDW: 12 % (ref 11.6–15.4)
WBC: 3.1 10*3/uL — ABNORMAL LOW (ref 3.4–10.8)

## 2022-04-06 LAB — HEPATIC FUNCTION PANEL
ALT: 23 IU/L (ref 0–44)
AST: 18 IU/L (ref 0–40)
Albumin: 4.7 g/dL (ref 3.8–4.8)
Alkaline Phosphatase: 99 IU/L (ref 44–121)
Bilirubin Total: 0.5 mg/dL (ref 0.0–1.2)
Bilirubin, Direct: 0.15 mg/dL (ref 0.00–0.40)
Total Protein: 7.3 g/dL (ref 6.0–8.5)

## 2022-04-06 LAB — LIPID PANEL
Chol/HDL Ratio: 3.3 ratio (ref 0.0–5.0)
Cholesterol, Total: 177 mg/dL (ref 100–199)
HDL: 54 mg/dL (ref >39)
LDL Chol Calc (NIH): 112 mg/dL — ABNORMAL HIGH (ref 0–99)
Triglycerides: 54 mg/dL (ref 0–149)
VLDL Cholesterol Cal: 11 mg/dL (ref 5–40)

## 2022-04-06 LAB — PSA: Prostate Specific Ag, Serum: 4.1 ng/mL — ABNORMAL HIGH (ref 0.0–4.0)

## 2022-04-10 ENCOUNTER — Ambulatory Visit (INDEPENDENT_AMBULATORY_CARE_PROVIDER_SITE_OTHER): Payer: Medicare Other | Admitting: Family Medicine

## 2022-04-10 VITALS — BP 116/78 | HR 86 | Temp 98.6°F | Ht 67.0 in | Wt 182.2 lb

## 2022-04-10 DIAGNOSIS — R972 Elevated prostate specific antigen [PSA]: Secondary | ICD-10-CM

## 2022-04-10 DIAGNOSIS — E785 Hyperlipidemia, unspecified: Secondary | ICD-10-CM

## 2022-04-10 DIAGNOSIS — Z0000 Encounter for general adult medical examination without abnormal findings: Secondary | ICD-10-CM | POA: Diagnosis not present

## 2022-04-10 NOTE — Progress Notes (Signed)
? ?Subjective:  ? ? Patient ID: Nathan Reynolds, male    DOB: [birth year], 66 y.o.   MRN: [id number] ? ?HPI ? ?The patient comes in today for a wellness visit. ?Safety discussed ?Dietary discussed ?Labs reviewed ? ?The 10-year ASCVD risk score (Arnett DK, et al., 2019) is: 9.2% ?  Values used to calculate the score: ?    Age: 43 years ?    Sex: Male ?    Is Non-Hispanic African American: Yes ?    Diabetic: No ?    Tobacco smoker: No ?    Systolic Blood Pressure: 099 mmHg ?    Is BP treated: No ?    HDL Cholesterol: 54 mg/dL ?    Total Cholesterol: 177 mg/dL ? ? ?A review of their  ?Results for orders placed or performed in visit on 12/27/21  ?Lipid Profile  ?Result Value Ref Range  ? Cholesterol, Total 177 100 - 199 mg/dL  ? Triglycerides 54 0 - 149 mg/dL  ? HDL 54 >39 mg/dL  ? VLDL Cholesterol Cal 11 5 - 40 mg/dL  ? LDL Chol Calc (NIH) 112 (H) 0 - 99 mg/dL  ? Chol/HDL Ratio 3.3 0.0 - 5.0 ratio  ?Hepatic function panel  ?Result Value Ref Range  ? Total Protein 7.3 6.0 - 8.5 g/dL  ? Albumin 4.7 3.8 - 4.8 g/dL  ? Bilirubin Total 0.5 0.0 - 1.2 mg/dL  ? Bilirubin, Direct 0.15 0.00 - 0.40 mg/dL  ? Alkaline Phosphatase 99 44 - 121 IU/L  ? AST 18 0 - 40 IU/L  ? ALT 23 0 - 44 IU/L  ?Basic Metabolic Panel (BMET)  ?Result Value Ref Range  ? Glucose 91 70 - 99 mg/dL  ? BUN 20 8 - 27 mg/dL  ? Creatinine, Ser 1.07 0.76 - 1.27 mg/dL  ? eGFR 77 >59 mL/min/1.73  ? BUN/Creatinine Ratio 19 10 - 24  ? Sodium 139 134 - 144 mmol/L  ? Potassium 4.4 3.5 - 5.2 mmol/L  ? Chloride 102 96 - 106 mmol/L  ? CO2 22 20 - 29 mmol/L  ? Calcium 9.6 8.6 - 10.2 mg/dL  ?CBC with Differential  ?Result Value Ref Range  ? WBC 3.1 (L) 3.4 - 10.8 x10E3/uL  ? RBC 5.02 4.14 - 5.80 x10E6/uL  ? Hemoglobin 15.7 13.0 - 17.7 g/dL  ? Hematocrit 46.4 37.5 - 51.0 %  ? MCV 92 79 - 97 fL  ? MCH 31.3 26.6 - 33.0 pg  ? MCHC 33.8 31.5 - 35.7 g/dL  ? RDW 12.0 11.6 - 15.4 %  ? Platelets 167 150 - 450 x10E3/uL  ? Neutrophils 37 Not Estab. %  ? Lymphs 50 Not Estab. %  ? Monocytes  9 Not Estab. %  ? Eos 2 Not Estab. %  ? Basos 2 Not Estab. %  ? Neutrophils Absolute 1.1 (L) 1.4 - 7.0 x10E3/uL  ? Lymphocytes Absolute 1.6 0.7 - 3.1 x10E3/uL  ? Monocytes Absolute 0.3 0.1 - 0.9 x10E3/uL  ? EOS (ABSOLUTE) 0.1 0.0 - 0.4 x10E3/uL  ? Basophils Absolute 0.1 0.0 - 0.2 x10E3/uL  ? Immature Granulocytes 0 Not Estab. %  ? Immature Grans (Abs) 0.0 0.0 - 0.1 x10E3/uL  ?PSA  ?Result Value Ref Range  ? Prostate Specific Ag, Serum 4.1 (H) 0.0 - 4.0 ng/mL  ? ?health history was completed. ? A review of medications was also completed. ? ?Any needed refills; No ? ?Eating habits: Good ? ?Falls/  MVA accidents in past few months: No ? ?  Regular exercise: walking 5-6 days a week ? ?Specialist pt sees on regular basis: No ? ?Preventative health issues were discussed.  ? ?Additional concerns: None  ? ?Review of Systems ? ?   ?Objective:  ? Physical Exam ? ?General-in no acute distress ?Eyes-no discharge ?Lungs-respiratory rate normal, CTA ?CV-no murmurs,RRR ?Extremities skin warm dry no edema ?Neuro grossly normal ?Behavior normal, alert ?Prostate exam mildly enlarged in the posterior right side firm to the touch ? ? ?   ?Assessment & Plan:  ?1. Well adult exam ?Adult wellness-complete.wellness physical was conducted today. Importance of diet and exercise were discussed in detail.  ?In addition to this a discussion regarding safety was also covered. We also reviewed over immunizations and gave recommendations regarding current immunization needed for age.  ?In addition to this additional areas were also touched on including: ?Preventative health exams needed: ? ?Colonoscopy up-to-date on colonoscopy ? ?Patient was advised yearly wellness exam ? ? ?2. Hyperlipidemia, unspecified hyperlipidemia type ?Healthy diet recommended ?Patient does not want to start statin ?9% risk of heart attack ?CT cardiac score recommended ? ?- CT CARDIAC SCORING (SELF PAY ONLY) ? ?3. Elevated PSA ?Referral to urology ?Needs further evaluation  for possibility of prostate cancer ? ?- PSA, total and free ? ?Shingles vaccine recommended ?Pneumococcal vaccine recommended ?Patient defers on both currently ?

## 2022-04-10 NOTE — Patient Instructions (Signed)

## 2022-04-11 NOTE — Progress Notes (Signed)
Referral generated

## 2022-04-24 ENCOUNTER — Ambulatory Visit (INDEPENDENT_AMBULATORY_CARE_PROVIDER_SITE_OTHER): Payer: Medicare Other | Admitting: Urology

## 2022-04-24 ENCOUNTER — Ambulatory Visit: Payer: BC Managed Care – PPO | Admitting: Urology

## 2022-04-24 ENCOUNTER — Encounter: Payer: Self-pay | Admitting: Urology

## 2022-04-24 VITALS — BP 117/78 | HR 80 | Ht 68.0 in | Wt 182.0 lb

## 2022-04-24 DIAGNOSIS — R972 Elevated prostate specific antigen [PSA]: Secondary | ICD-10-CM | POA: Diagnosis not present

## 2022-04-24 NOTE — Progress Notes (Signed)
Assessment: 1. Elevated PSA     Plan: Today I had a long discussion with the patient regarding PSA and the rationale and controversies of prostate cancer early detection.  I discussed the pros and cons of further evaluation including TRUS and prostate Bx.  Potential adverse events and complications as well as standard instructions were given.  Patient expressed his understanding of these issues. He did not wish to proceed with a prostate biopsy at this time. IsoPSA today Will contact him with results  Chief Complaint:  Chief Complaint  Patient presents with   Elevated PSA    History of Present Illness:  Nathan Reynolds is a 66 y.o. year old male who is seen in consultation from Kathyrn Drown, MD for evaluation of elevated PSA. PSA results: 1/19 1.9 1/20 2.2 2/21 2.9 2/22 3.6 5/23 4.1  No prior history of elevated PSA.  No prior prostate biopsy.  No history of prostatitis or UTIs.  No family history of prostate cancer. He reports nocturia x2.  He has occasional hesitancy and postvoid dribbling.  No dysuria or gross hematuria. IPSS = 13 today.  Past Medical History:  Past Medical History:  Diagnosis Date   Hyperglycemia     Past Surgical History:  Past Surgical History:  Procedure Laterality Date   COLONOSCOPY     COLONOSCOPY N/A 02/14/2017   Procedure: COLONOSCOPY;  Surgeon: Daneil Dolin, MD;  [facility];  Service: Endoscopy;  Laterality: N/A;  7:30 AM   COLONOSCOPY N/A 05/19/2020   Procedure: COLONOSCOPY;  Surgeon: Daneil Dolin, MD;  [facility];  Service: Endoscopy;  Laterality: N/A;  10:30   POLYPECTOMY  02/14/2017   Procedure: POLYPECTOMY;  Surgeon: Daneil Dolin, MD;  [facility];  Service: Endoscopy;;  cecal, ascending, decending   POLYPECTOMY  05/19/2020   Procedure: POLYPECTOMY;  Surgeon: Daneil Dolin, MD;  [facility];  Service: Endoscopy;;    Allergies:  No Known Allergies  Family History:   Family History  Problem Relation Age of Onset   Diabetes Father     Social History:  Social History   Tobacco Use   Smoking status: Never   Smokeless tobacco: Never  Vaping Use   Vaping Use: Never used  Substance Use Topics   Alcohol use: No   Drug use: No    Review of symptoms:  Constitutional:  Negative for unexplained weight loss, night sweats, fever, chills ENT:  Negative for nose bleeds, sinus pain, painful swallowing CV:  Negative for chest pain, shortness of breath, exercise intolerance, palpitations, loss of consciousness Resp:  Negative for cough, wheezing, shortness of breath GI:  Negative for nausea, vomiting, diarrhea, bloody stools GU:  Positives noted in HPI; otherwise negative for gross hematuria, dysuria, urinary incontinence Neuro:  Negative for seizures, poor balance, limb weakness, slurred speech Psych:  Negative for lack of energy, depression, anxiety Endocrine:  Negative for polydipsia, polyuria, symptoms of hypoglycemia (dizziness, hunger, sweating) Hematologic:  Negative for anemia, purpura, petechia, prolonged or excessive bleeding, use of anticoagulants  Allergic:  Negative for difficulty breathing or choking as a result of exposure to anything; no shellfish allergy; no allergic response (rash/itch) to materials, foods  Physical exam: BP 117/78   Pulse 80   Ht '5\' 8"'$  (1.727 m)   Wt 182 lb (82.6 kg)   BMI 27.67 kg/m  GENERAL APPEARANCE:  Well appearing, well developed, well nourished, NAD HEENT: Atraumatic, Normocephalic, oropharynx clear. NECK: Supple without  lymphadenopathy or thyromegaly. LUNGS: Clear to auscultation bilaterally. HEART: Regular Rate and Rhythm without murmurs, gallops, or rubs. ABDOMEN: Soft, non-tender, No Masses. EXTREMITIES: Moves all extremities well.  Without clubbing, cyanosis, or edema. NEUROLOGIC:  Alert and oriented x 3, normal gait, CN II-XII grossly intact.  MENTAL STATUS:  Appropriate. BACK:  Non-tender to  palpation.  No CVAT SKIN:  Warm, dry and intact.  GU: Penis:  circumcised Meatus: Normal Scrotum: normal, no masses Testis: normal without masses bilateral Epididymis: normal Prostate: 40 g, NT, slight asymmetry R>L Rectum: Normal tone,  no masses or tenderness    Results: U/A:  0-5 WBC, 0-2 RBC, calcium oxalate crystals

## 2022-04-25 DIAGNOSIS — R972 Elevated prostate specific antigen [PSA]: Secondary | ICD-10-CM | POA: Diagnosis not present

## 2022-04-25 LAB — URINALYSIS, ROUTINE W REFLEX MICROSCOPIC
Bilirubin, UA: NEGATIVE
Glucose, UA: NEGATIVE
Ketones, UA: NEGATIVE
Leukocytes,UA: NEGATIVE
Nitrite, UA: NEGATIVE
Protein,UA: NEGATIVE
Specific Gravity, UA: >1.03 — ABNORMAL HIGH (ref 1.005–1.030)
Urobilinogen, Ur: 0.2 mg/dL (ref 0.2–1.0)
pH, UA: 5.5 (ref 5.0–7.5)

## 2022-04-25 LAB — MICROSCOPIC EXAMINATION
Bacteria, UA: NONE SEEN
Epithelial Cells (non renal): NONE SEEN /hpf (ref 0–10)
Renal Epithel, UA: NONE SEEN /hpf

## 2022-04-30 ENCOUNTER — Other Ambulatory Visit: Payer: Self-pay | Admitting: Urology

## 2022-05-08 ENCOUNTER — Other Ambulatory Visit: Payer: Self-pay | Admitting: Urology

## 2022-10-03 ENCOUNTER — Ambulatory Visit (INDEPENDENT_AMBULATORY_CARE_PROVIDER_SITE_OTHER): Payer: Medicare Other

## 2022-10-03 VITALS — Ht 68.0 in | Wt 182.0 lb

## 2022-10-03 DIAGNOSIS — Z0000 Encounter for general adult medical examination without abnormal findings: Secondary | ICD-10-CM | POA: Diagnosis not present

## 2022-10-03 NOTE — Progress Notes (Signed)
Virtual Visit via Telephone Note  I connected with  Nathan Reynolds on 10/03/22 at  8:15 AM EDT by telephone and verified that I am speaking with the correct person using two identifiers.  Location: Patient: home Provider: [facility] Persons participating in the virtual visit: patient/Nurse Health Advisor   I discussed the limitations, risks, security and privacy concerns of performing an evaluation and management service by telephone and the availability of in person appointments. The patient expressed understanding and agreed to proceed.  Interactive audio and video telecommunications were attempted between this nurse and patient, however failed, due to patient having technical difficulties OR patient did not have access to video capability.  We continued and completed visit with audio only.  Some vital signs may be absent or patient reported.   Dionisio David, LPN  Subjective:   Nathan Reynolds is a 66 y.o. male who presents for Medicare Annual/Subsequent preventive examination.  Review of Systems     Cardiac Risk Factors include: advanced age (>74mn, >>21women);male gender     Objective:    Today's Vitals   10/03/22 0827  Weight: 182 lb (82.6 kg)  Height: '5\' 8"'$  (1.727 m)   Body mass index is 27.67 kg/m.     10/03/2022    8:19 AM 05/19/2020    9:35 AM 02/14/2017    6:46 AM  Advanced Directives  Does Patient Have a Medical Advance Directive? No No No  Would patient like information on creating a medical advance directive? No - Patient declined No - Patient declined No - Patient declined    Current Medications (verified) Outpatient Encounter Medications as of 10/03/2022  Medication Sig   Biotin (VB7 MAX) 100 MG/GM POWD Take by mouth.   BIOTIN PO Take 1 tablet by mouth daily.    Cholecalciferol (VITAMIN D-1000 MAX ST) 25 MCG (1000 UT) tablet Take by mouth.   Cholecalciferol (VITAMIN D3) 5000 units CAPS Take 5,000 Units by mouth daily.   fish oil-omega-3 fatty acids 1000 MG  capsule Take 2 g by mouth daily.   ibuprofen (ADVIL) 600 MG tablet 1 tid prn   Multiple Vitamin (MULTIVITAMIN) tablet Take 1 tablet by mouth daily.   ofloxacin (OCUFLOX) 0.3 % ophthalmic solution Instill one drop OD QID x 1 week (Patient not taking: Reported on 10/03/2022)   No facility-administered encounter medications on file as of 10/03/2022.    Allergies (verified) Patient has no known allergies.   History: Past Medical History:  Diagnosis Date   Hyperglycemia    Past Surgical History:  Procedure Laterality Date   COLONOSCOPY     COLONOSCOPY N/A 02/14/2017   Procedure: COLONOSCOPY;  Surgeon: RDaneil Dolin MD;  [facility];  Service: Endoscopy;  Laterality: N/A;  7:30 AM   COLONOSCOPY N/A 05/19/2020   Procedure: COLONOSCOPY;  Surgeon: RDaneil Dolin MD;  [facility];  Service: Endoscopy;  Laterality: N/A;  10:30   POLYPECTOMY  02/14/2017   Procedure: POLYPECTOMY;  Surgeon: RDaneil Dolin MD;  [facility];  Service: Endoscopy;;  cecal, ascending, decending   POLYPECTOMY  05/19/2020   Procedure: POLYPECTOMY;  Surgeon: RDaneil Dolin MD;  [facility];  Service: Endoscopy;;   Family History  Problem Relation Age of Onset   Diabetes Father    Social History   Socioeconomic History   Marital status: Married    Spouse name: Not on file   Number of children: Not on file   Years of education:  Not on file   Highest education level: Not on file  Occupational History   Not on file  Tobacco Use   Smoking status: Never   Smokeless tobacco: Never  Vaping Use   Vaping Use: Never used  Substance and Sexual Activity   Alcohol use: No   Drug use: No   Sexual activity: Not on file  Other Topics Concern   Not on file  Social History Narrative   Not on file   Social Determinants of Health   Financial Resource Strain: Low Risk  (10/03/2022)   Overall Financial Resource Strain (CARDIA)    Difficulty of Paying Living Expenses: Not  hard at all  Food Insecurity: No Food Insecurity (10/03/2022)   Hunger Vital Sign    Worried About Running Out of Food in the Last Year: Never true    Ran Out of Food in the Last Year: Never true  Transportation Needs: No Transportation Needs (10/03/2022)   PRAPARE - Hydrologist (Medical): No    Lack of Transportation (Non-Medical): No  Physical Activity: Sufficiently Active (10/03/2022)   Exercise Vital Sign    Days of Exercise per Week: 7 days    Minutes of Exercise per Session: 30 min  Stress: No Stress Concern Present (10/03/2022)   [place]    Feeling of Stress : Not at all  Social Connections: Unknown (10/03/2022)   Social Connection and Isolation Panel [NHANES]    Frequency of Communication with Friends and Family: More than three times a week    Frequency of Social Gatherings with Friends and Family: More than three times a week    Attends Religious Services: More than 4 times per year    Active Member of [employer] or Organizations: No    Attends Music therapist: Never    Marital Status: Not on file    Tobacco Counseling Counseling given: Not Answered   Clinical Intake:  Pre-visit preparation completed: Yes  Pain : No/denies pain     Nutritional Risks: Non-healing wound Diabetes: No  How often do you need to have someone help you when you read instructions, pamphlets, or other written materials from your doctor or pharmacy?: 1 - Never  Diabetic?no  Interpreter Needed?: No  Information entered by :: Kirke Shaggy, LPN   Activities of Daily Living    10/03/2022    8:20 AM  In your present state of health, do you have any difficulty performing the following activities:  Hearing? 0  Vision? 0  Difficulty concentrating or making decisions? 0  Walking or climbing stairs? 0  Dressing or bathing? 0  Doing errands, shopping? 0  Preparing Food and eating ? N   Using the Toilet? N  In the past six months, have you accidently leaked urine? N  Do you have problems with loss of bowel control? N  Managing your Medications? N  Managing your Finances? N  Housekeeping or managing your Housekeeping? N    Patient Care Team: Kathyrn Drown, MD as PCP - General (Family Medicine)  Indicate any recent Medical Services you may have received from other than [facility] providers in the past year (date may be approximate).     Assessment:   This is a routine wellness examination for [place].  Hearing/Vision screen Hearing Screening - Comments:: No aids Vision Screening - Comments:: Wears glasses- [facility]  Dietary issues and exercise activities discussed: Current Exercise Habits: Home exercise  routine, Type of exercise: walking;strength training/weights, Time (Minutes): 30, Frequency (Times/Week): 7, Weekly Exercise (Minutes/Week): 210, Intensity: Mild   Goals Addressed             This Visit's Progress    DIET - EAT MORE FRUITS AND VEGETABLES         Depression Screen    10/03/2022    8:18 AM 01/12/2021    9:37 AM 12/22/2017    9:04 AM 11/10/2015    9:31 AM  PHQ 2/9 Scores  PHQ - 2 Score 0 0 0 0  PHQ- 9 Score 0       Fall Risk    10/03/2022    8:20 AM 04/10/2022    1:47 PM 01/12/2021    9:37 AM 11/10/2015    9:31 AM  [place] in the past year? 0 0 0 No  Number falls in past yr: 0 0    Injury with Fall? 0 0    Risk for fall due to : No Fall Risks No Fall Risks    Follow up Falls prevention discussed;Falls evaluation completed Falls evaluation completed Falls evaluation completed     FALL RISK PREVENTION PERTAINING TO THE HOME:  Any stairs in or around the home? No  If so, are there any without handrails? No  Home free of loose throw rugs in walkways, pet beds, electrical cords, etc? Yes  Adequate lighting in your home to reduce risk of falls? Yes   ASSISTIVE DEVICES UTILIZED TO PREVENT FALLS:  Life alert? No  Use of  a cane, walker or w/c? No  Grab bars in the bathroom? No  Shower chair or bench in shower? Yes - actually NO Elevated toilet seat or a handicapped toilet? Yes    Cognitive Function:        10/03/2022    8:20 AM  6CIT Screen  What Year? 0 points  What month? 0 points  What time? 0 points  Count back from 20 0 points  Months in reverse 0 points  Repeat phrase 0 points  Total Score 0 points    Immunizations Immunization History  Administered Date(s) Administered   PFIZER(Purple Top)SARS-COV-2 Vaccination 02/06/2020, 02/27/2020, 11/23/2020   Td 12/02/1995    TDAP status: Due, Education has been provided regarding the importance of this vaccine. Advised may receive this vaccine at local pharmacy or Health Dept. Aware to provide a copy of the vaccination record if obtained from local pharmacy or Health Dept. Verbalized acceptance and understanding.  Flu Vaccine status: Declined, Education has been provided regarding the importance of this vaccine but patient still declined. Advised may receive this vaccine at local pharmacy or Health Dept. Aware to provide a copy of the vaccination record if obtained from local pharmacy or Health Dept. Verbalized acceptance and understanding.  Pneumococcal vaccine status: Declined,  Education has been provided regarding the importance of this vaccine but patient still declined. Advised may receive this vaccine at local pharmacy or Health Dept. Aware to provide a copy of the vaccination record if obtained from local pharmacy or Health Dept. Verbalized acceptance and understanding.   Covid-19 vaccine status: Completed vaccines  Qualifies for Shingles Vaccine? Yes   Zostavax completed No   Shingrix Completed?: No.    Education has been provided regarding the importance of this vaccine. Patient has been advised to call insurance company to determine out of pocket expense if they have not yet received this vaccine. Advised may also receive vaccine at local  pharmacy or Health Dept. Verbalized acceptance and understanding.  Screening Tests Health Maintenance  Topic Date Due   Zoster Vaccines- Shingrix (1 of 2) Never done   TETANUS/TDAP  12/01/2005   COVID-19 Vaccine (4 - Pfizer risk series) 01/18/2021   Pneumonia Vaccine 77+ Years old (1 - PCV) Never done   Medicare Annual Wellness (AWV)  10/04/2023   COLONOSCOPY (Pts 45-71yr Insurance coverage will need to be confirmed)  05/19/2025   Hepatitis C Screening  Completed   HPV VACCINES  Aged Out   INFLUENZA VACCINE  Discontinued    Health Maintenance  Health Maintenance Due  Topic Date Due   Zoster Vaccines- Shingrix (1 of 2) Never done   TETANUS/TDAP  12/01/2005   COVID-19 Vaccine (4 - Pfizer risk series) 01/18/2021   Pneumonia Vaccine 66 Years old (1 - PCV) Never done    Colorectal cancer screening: Type of screening: Colonoscopy. Completed 05/19/20. Repeat every 5 years  Lung Cancer Screening: (Low Dose CT Chest recommended if Age 66-80years, 30 pack-year currently smoking OR have quit w/in 15years.) does not qualify.     Additional Screening:  Hepatitis C Screening: does qualify; Completed 12/19/17  Vision Screening: Recommended annual ophthalmology exams for early detection of glaucoma and other disorders of the eye. Is the patient up to date with their annual eye exam?  Yes  Who is the provider or what is the name of the office in which the patient attends annual eye exams? [facility] If pt is not established with a provider, would they like to be referred to a provider to establish care? No .   Dental Screening: Recommended annual dental exams for proper oral hygiene  Community Resource Referral / Chronic Care Management: CRR required this visit?  No   CCM required this visit?  No      Plan:     I have personally reviewed and noted the following in the patient's chart:   Medical and social history Use of alcohol, tobacco or illicit drugs  Current  medications and supplements including opioid prescriptions. Patient is not currently taking opioid prescriptions. Functional ability and status Nutritional status Physical activity Advanced directives List of other physicians Hospitalizations, surgeries, and ER visits in previous 12 months Vitals Screenings to include cognitive, depression, and falls Referrals and appointments  In addition, I have reviewed and discussed with patient certain preventive protocols, quality metrics, and best practice recommendations. A written personalized care plan for preventive services as well as general preventive health recommendations were provided to patient.     LDionisio David LPN   [id number]  Nurse Notes: none

## 2022-10-03 NOTE — Patient Instructions (Signed)
[name] , Thank you for taking time to come for your Medicare Wellness Visit. I appreciate your ongoing commitment to your health goals. Please review the following plan we discussed and let me know if I can assist you in the future.   Screening recommendations/referrals: Colonoscopy: 05/19/20 Recommended yearly ophthalmology/optometry visit for glaucoma screening and checkup Recommended yearly dental visit for hygiene and checkup  Vaccinations: Influenza vaccine: n/d Pneumococcal vaccine: n/d Tdap vaccine: 12/02/1995, due if have injury Shingles vaccine: n/d   Covid-19: 02/06/20, 02/27/20, 11/23/20  Advanced directives: no  Conditions/risks identified: none  Next appointment: Follow up in one year for your annual wellness visit. 10/07/23 @ 2:10 pm by phone  Preventive Care 66 Years and Older, Male Preventive care refers to lifestyle choices and visits with your health care provider that can promote health and wellness. What does preventive care include? A yearly physical exam. This is also called an annual well check. Dental exams once or twice a year. Routine eye exams. Ask your health care provider how often you should have your eyes checked. Personal lifestyle choices, including: Daily care of your teeth and gums. Regular physical activity. Eating a healthy diet. Avoiding tobacco and drug use. Limiting alcohol use. Practicing safe sex. Taking low doses of aspirin every day. Taking vitamin and mineral supplements as recommended by your health care provider. What happens during an annual well check? The services and screenings done by your health care provider during your annual well check will depend on your age, overall health, lifestyle risk factors, and family history of disease. Counseling  Your health care provider may ask you questions about your: Alcohol use. Tobacco use. Drug use. Emotional well-being. Home and relationship well-being. Sexual activity. Eating  habits. History of falls. Memory and ability to understand (cognition). Work and work Statistician. Screening  You may have the following tests or measurements: Height, weight, and BMI. Blood pressure. Lipid and cholesterol levels. These may be checked every 5 years, or more frequently if you are over 20 years old. Skin check. Lung cancer screening. You may have this screening every year starting at age 66 if you have a 30-pack-year history of smoking and currently smoke or have quit within the past 15 years. Fecal occult blood test (FOBT) of the stool. You may have this test every year starting at age 66. Flexible sigmoidoscopy or colonoscopy. You may have a sigmoidoscopy every 5 years or a colonoscopy every 10 years starting at age 40. Prostate cancer screening. Recommendations will vary depending on your family history and other risks. Hepatitis C blood test. Hepatitis B blood test. Sexually transmitted disease (STD) testing. Diabetes screening. This is done by checking your blood sugar (glucose) after you have not eaten for a while (fasting). You may have this done every 1-3 years. Abdominal aortic aneurysm (AAA) screening. You may need this if you are a current or former smoker. Osteoporosis. You may be screened starting at age 23 if you are at high risk. Talk with your health care provider about your test results, treatment options, and if necessary, the need for more tests. Vaccines  Your health care provider may recommend certain vaccines, such as: Influenza vaccine. This is recommended every year. Tetanus, diphtheria, and acellular pertussis (Tdap, Td) vaccine. You may need a Td booster every 10 years. Zoster vaccine. You may need this after age 46. Pneumococcal 13-valent conjugate (PCV13) vaccine. One dose is recommended after age 16. Pneumococcal polysaccharide (PPSV23) vaccine. One dose is recommended after age 70. Talk  to your health care provider about which screenings and  vaccines you need and how often you need them. This information is not intended to replace advice given to you by your health care provider. Make sure you discuss any questions you have with your health care provider. Document Released: 12/15/2015 Document Revised: 08/07/2016 Document Reviewed: 09/19/2015 Elsevier Interactive Patient Education  2017 [employer] Prevention in the Home Falls can cause injuries. They can happen to people of all ages. There are many things you can do to make your home safe and to help prevent falls. What can I do on the outside of my home? Regularly fix the edges of walkways and driveways and fix any cracks. Remove anything that might make you trip as you walk through a door, such as a raised step or threshold. Trim any bushes or trees on the path to your home. Use bright outdoor lighting. Clear any walking paths of anything that might make someone trip, such as rocks or tools. Regularly check to see if handrails are loose or broken. Make sure that both sides of any steps have handrails. Any raised decks and porches should have guardrails on the edges. Have any leaves, snow, or ice cleared regularly. Use sand or salt on walking paths during winter. Clean up any spills in your garage right away. This includes oil or grease spills. What can I do in the bathroom? Use night lights. Install grab bars by the toilet and in the tub and shower. Do not use towel bars as grab bars. Use non-skid mats or decals in the tub or shower. If you need to sit down in the shower, use a plastic, non-slip stool. Keep the floor dry. Clean up any water that spills on the floor as soon as it happens. Remove soap buildup in the tub or shower regularly. Attach bath mats securely with double-sided non-slip rug tape. Do not have throw rugs and other things on the floor that can make you trip. What can I do in the bedroom? Use night lights. Make sure that you have a light by your  bed that is easy to reach. Do not use any sheets or blankets that are too big for your bed. They should not hang down onto the floor. Have a firm chair that has side arms. You can use this for support while you get dressed. Do not have throw rugs and other things on the floor that can make you trip. What can I do in the kitchen? Clean up any spills right away. Avoid walking on wet floors. Keep items that you use a lot in easy-to-reach places. If you need to reach something above you, use a strong step stool that has a grab bar. Keep electrical cords out of the way. Do not use floor polish or wax that makes floors slippery. If you must use wax, use non-skid floor wax. Do not have throw rugs and other things on the floor that can make you trip. What can I do with my stairs? Do not leave any items on the stairs. Make sure that there are handrails on both sides of the stairs and use them. Fix handrails that are broken or loose. Make sure that handrails are as long as the stairways. Check any carpeting to make sure that it is firmly attached to the stairs. Fix any carpet that is loose or worn. Avoid having throw rugs at the top or bottom of the stairs. If you do have throw rugs,  attach them to the floor with carpet tape. Make sure that you have a light switch at the top of the stairs and the bottom of the stairs. If you do not have them, ask someone to add them for you. What else can I do to help prevent falls? Wear shoes that: Do not have high heels. Have rubber bottoms. Are comfortable and fit you well. Are closed at the toe. Do not wear sandals. If you use a stepladder: Make sure that it is fully opened. Do not climb a closed stepladder. Make sure that both sides of the stepladder are locked into place. Ask someone to hold it for you, if possible. Clearly mark and make sure that you can see: Any grab bars or handrails. First and last steps. Where the edge of each step is. Use tools that  help you move around (mobility aids) if they are needed. These include: Canes. Walkers. Scooters. Crutches. Turn on the lights when you go into a dark area. Replace any light bulbs as soon as they burn out. Set up your furniture so you have a clear path. Avoid moving your furniture around. If any of your floors are uneven, fix them. If there are any pets around you, be aware of where they are. Review your medicines with your doctor. Some medicines can make you feel dizzy. This can increase your chance of falling. Ask your doctor what other things that you can do to help prevent falls. This information is not intended to replace advice given to you by your health care provider. Make sure you discuss any questions you have with your health care provider. Document Released: 09/14/2009 Document Revised: 04/25/2016 Document Reviewed: 12/23/2014 Elsevier Interactive Patient Education  2017 [employer].

## 2022-10-30 ENCOUNTER — Ambulatory Visit (INDEPENDENT_AMBULATORY_CARE_PROVIDER_SITE_OTHER): Payer: Medicare Other | Admitting: Urology

## 2022-10-30 ENCOUNTER — Encounter: Payer: Self-pay | Admitting: Urology

## 2022-10-30 VITALS — BP 132/83 | HR 76 | Ht 68.0 in | Wt 185.0 lb

## 2022-10-30 DIAGNOSIS — R972 Elevated prostate specific antigen [PSA]: Secondary | ICD-10-CM

## 2022-10-30 DIAGNOSIS — N401 Enlarged prostate with lower urinary tract symptoms: Secondary | ICD-10-CM

## 2022-10-30 DIAGNOSIS — N138 Other obstructive and reflux uropathy: Secondary | ICD-10-CM

## 2022-10-30 LAB — POCT URINALYSIS DIPSTICK
Bilirubin, UA: NEGATIVE
Glucose, UA: NEGATIVE
Ketones, UA: NEGATIVE
Leukocytes, UA: NEGATIVE
Nitrite, UA: NEGATIVE
Protein, UA: NEGATIVE
Spec Grav, UA: 1.02 (ref 1.010–1.025)
Urobilinogen, UA: 0.2 E.U./dL
pH, UA: 6.5 (ref 5.0–8.0)

## 2022-10-30 NOTE — Progress Notes (Signed)
Assessment: 1. Elevated PSA   2. BPH with obstruction/lower urinary tract symptoms     Plan: Free and total PSA today Will contact him with results  Chief Complaint:  Chief Complaint  Patient presents with   Elevated PSA    History of Present Illness:  Nathan Reynolds is a 66 y.o. year old male who is seen for further evaluation of elevated PSA. PSA results: 1/19 1.9 1/20 2.2 2/21 2.9 2/22 3.6 5/23 4.1  No prior history of elevated PSA.  No prior prostate biopsy.  No history of prostatitis or UTIs.  No family history of prostate cancer. He reported nocturia x2, occasional hesitancy and postvoid dribbling.  No dysuria or gross hematuria. IPSS = 13.  IsoPSA from 5/23 showed a PSA of 4.3 with an iso-PSA of 4.8 indicating a low risk of high-grade prostate cancer.  He returns today for follow-up.  No new urinary symptoms.  He continues with nocturia x 2.  No dysuria or gross hematuria. IPSS = 9 today.  Portions of the above documentation were copied from a prior visit for review purposes only.   Past Medical History:  Past Medical History:  Diagnosis Date   Hyperglycemia     Past Surgical History:  Past Surgical History:  Procedure Laterality Date   COLONOSCOPY     COLONOSCOPY N/A 02/14/2017   Procedure: COLONOSCOPY;  Surgeon: Daneil Dolin, MD;  [facility];  Service: Endoscopy;  Laterality: N/A;  7:30 AM   COLONOSCOPY N/A 05/19/2020   Procedure: COLONOSCOPY;  Surgeon: Daneil Dolin, MD;  Location: [facility];  Service: Endoscopy;  Laterality: N/A;  10:30   POLYPECTOMY  02/14/2017   Procedure: POLYPECTOMY;  Surgeon: Daneil Dolin, MD;  Location: [facility];  Service: Endoscopy;;  cecal, ascending, decending   POLYPECTOMY  05/19/2020   Procedure: POLYPECTOMY;  Surgeon: Daneil Dolin, MD;  Location: [facility];  Service: Endoscopy;;    Allergies:  No Known Allergies  Family History:  Family History  Problem Relation Age of Onset    Diabetes Father     Social History:  Social History   Tobacco Use   Smoking status: Never   Smokeless tobacco: Never  Vaping Use   Vaping Use: Never used  Substance Use Topics   Alcohol use: No   Drug use: No    ROS: Constitutional:  Negative for fever, chills, weight loss CV: Negative for chest pain, previous MI, hypertension Respiratory:  Negative for shortness of breath, wheezing, sleep apnea, frequent cough GI:  Negative for nausea, vomiting, bloody stool, GERD  Physical exam: BP 132/83   Pulse 76   Ht '5\' 8"'$  (1.727 m)   Wt 185 lb (83.9 kg)   BMI 28.13 kg/m  GENERAL APPEARANCE:  Well appearing, well developed, well nourished, NAD HEENT:  Atraumatic, normocephalic, oropharynx clear NECK:  Supple without lymphadenopathy or thyromegaly ABDOMEN:  Soft, non-tender, no masses EXTREMITIES:  Moves all extremities well, without clubbing, cyanosis, or edema NEUROLOGIC:  Alert and oriented x 3, normal gait, CN II-XII grossly intact MENTAL STATUS:  appropriate BACK:  Non-tender to palpation, No CVAT SKIN:  Warm, dry, and intact GU: Prostate: 40 g, NT, slight asymmetry R>L, unchanged from prior exam Rectum: Normal tone,  no masses or tenderness   Results: Results for orders placed or performed in visit on 10/30/22 (from the past 24 hour(s))  POCT urinalysis dipstick     Status: None   Collection Time: 10/30/22 10:02 AM  Result Value Ref Range   Color, UA     Clarity, UA     Glucose, UA Negative Negative   Bilirubin, UA negative    Ketones, UA negative    Spec Grav, UA 1.020 1.010 - 1.025   Blood, UA trace-intact    pH, UA 6.5 5.0 - 8.0   Protein, UA Negative Negative   Urobilinogen, UA 0.2 0.2 or 1.0 E.U./dL   Nitrite, UA negative    Leukocytes, UA Negative Negative   Appearance     Odor

## 2022-10-31 ENCOUNTER — Encounter: Payer: Self-pay | Admitting: Urology

## 2022-10-31 LAB — PSA, TOTAL AND FREE
PSA, Free Pct: 21.5 %
PSA, Free: 0.84 ng/mL
Prostate Specific Ag, Serum: 3.9 ng/mL (ref 0.0–4.0)

## 2023-01-07 DIAGNOSIS — H52223 Regular astigmatism, bilateral: Secondary | ICD-10-CM | POA: Diagnosis not present

## 2023-02-10 DIAGNOSIS — K080 Exfoliation of teeth due to systemic causes: Secondary | ICD-10-CM | POA: Diagnosis not present

## 2023-04-15 ENCOUNTER — Other Ambulatory Visit: Payer: Self-pay | Admitting: Family Medicine

## 2023-04-15 ENCOUNTER — Encounter: Payer: Medicare Other | Admitting: Family Medicine

## 2023-05-05 ENCOUNTER — Telehealth: Payer: Self-pay | Admitting: Family Medicine

## 2023-05-05 DIAGNOSIS — E785 Hyperlipidemia, unspecified: Secondary | ICD-10-CM

## 2023-05-05 DIAGNOSIS — D72819 Decreased white blood cell count, unspecified: Secondary | ICD-10-CM

## 2023-05-05 DIAGNOSIS — Z0000 Encounter for general adult medical examination without abnormal findings: Secondary | ICD-10-CM

## 2023-05-05 NOTE — Telephone Encounter (Signed)
Patient has appointment on 6/5 and would like to get labs done.

## 2023-05-05 NOTE — Telephone Encounter (Signed)
Nurses I would recommend CBC, lipid, liver, metabolic 7 Wellness, leukopenia, hyperlipidemia As for the PSA please tell the patient we can order a PSA but if it is being followed by specialist then we will leave that to the specialist Please clarify with patient is his PSA being followed on a regular basis by specialist? Nurses-order PSA if not be followed by specialist

## 2023-05-06 NOTE — Telephone Encounter (Signed)
PSA was completed by Urologist 10/30/22 Blood work ordered in [employer]. Patient notfied

## 2023-05-07 ENCOUNTER — Encounter: Payer: Medicare Other | Admitting: Family Medicine

## 2023-06-19 DIAGNOSIS — E785 Hyperlipidemia, unspecified: Secondary | ICD-10-CM | POA: Diagnosis not present

## 2023-06-19 DIAGNOSIS — Z0000 Encounter for general adult medical examination without abnormal findings: Secondary | ICD-10-CM | POA: Diagnosis not present

## 2023-06-19 DIAGNOSIS — D72819 Decreased white blood cell count, unspecified: Secondary | ICD-10-CM | POA: Diagnosis not present

## 2023-06-20 LAB — HEPATIC FUNCTION PANEL
ALT: 21 IU/L (ref 0–44)
AST: 17 IU/L (ref 0–40)
Albumin: 4.6 g/dL (ref 3.9–4.9)
Alkaline Phosphatase: 106 IU/L (ref 44–121)
Bilirubin Total: 0.5 mg/dL (ref 0.0–1.2)
Bilirubin, Direct: 0.12 mg/dL (ref 0.00–0.40)
Total Protein: 7.4 g/dL (ref 6.0–8.5)

## 2023-06-20 LAB — CBC WITH DIFFERENTIAL/PLATELET
Basophils Absolute: 0 10*3/uL (ref 0.0–0.2)
Basos: 1 %
EOS (ABSOLUTE): 0.1 10*3/uL (ref 0.0–0.4)
Eos: 3 %
Hematocrit: 46.2 % (ref 37.5–51.0)
Hemoglobin: 15.6 g/dL (ref 13.0–17.7)
Immature Grans (Abs): 0 10*3/uL (ref 0.0–0.1)
Immature Granulocytes: 0 %
Lymphocytes Absolute: 2 10*3/uL (ref 0.7–3.1)
Lymphs: 48 %
MCH: 31 pg (ref 26.6–33.0)
MCHC: 33.8 g/dL (ref 31.5–35.7)
MCV: 92 fL (ref 79–97)
Monocytes Absolute: 0.3 10*3/uL (ref 0.1–0.9)
Monocytes: 7 %
Neutrophils Absolute: 1.7 10*3/uL (ref 1.4–7.0)
Neutrophils: 41 %
Platelets: 168 10*3/uL (ref 150–450)
RBC: 5.04 x10E6/uL (ref 4.14–5.80)
RDW: 11.7 % (ref 11.6–15.4)
WBC: 4 10*3/uL (ref 3.4–10.8)

## 2023-06-20 LAB — BASIC METABOLIC PANEL
BUN/Creatinine Ratio: 12 (ref 10–24)
BUN: 12 mg/dL (ref 8–27)
CO2: 23 mmol/L (ref 20–29)
Calcium: 9.5 mg/dL (ref 8.6–10.2)
Chloride: 101 mmol/L (ref 96–106)
Creatinine, Ser: 0.99 mg/dL (ref 0.76–1.27)
Glucose: 103 mg/dL — ABNORMAL HIGH (ref 70–99)
Potassium: 4.5 mmol/L (ref 3.5–5.2)
Sodium: 140 mmol/L (ref 134–144)
eGFR: 83 mL/min/{1.73_m2} (ref >59)

## 2023-06-20 LAB — LIPID PANEL
Chol/HDL Ratio: 4.1 ratio (ref 0.0–5.0)
Cholesterol, Total: 192 mg/dL (ref 100–199)
HDL: 47 mg/dL (ref >39)
LDL Chol Calc (NIH): 123 mg/dL — ABNORMAL HIGH (ref 0–99)
Triglycerides: 122 mg/dL (ref 0–149)
VLDL Cholesterol Cal: 22 mg/dL (ref 5–40)

## 2023-06-23 ENCOUNTER — Encounter: Payer: Self-pay | Admitting: Family Medicine

## 2023-06-23 ENCOUNTER — Ambulatory Visit (INDEPENDENT_AMBULATORY_CARE_PROVIDER_SITE_OTHER): Payer: Medicare Other | Admitting: Family Medicine

## 2023-06-23 VITALS — BP 132/86 | HR 77 | Ht 67.0 in | Wt 195.8 lb

## 2023-06-23 DIAGNOSIS — E785 Hyperlipidemia, unspecified: Secondary | ICD-10-CM

## 2023-06-23 DIAGNOSIS — R972 Elevated prostate specific antigen [PSA]: Secondary | ICD-10-CM

## 2023-06-23 DIAGNOSIS — Z0001 Encounter for general adult medical examination with abnormal findings: Secondary | ICD-10-CM | POA: Diagnosis not present

## 2023-06-23 DIAGNOSIS — Z23 Encounter for immunization: Secondary | ICD-10-CM | POA: Diagnosis not present

## 2023-06-23 DIAGNOSIS — Z0000 Encounter for general adult medical examination without abnormal findings: Secondary | ICD-10-CM

## 2023-06-23 NOTE — Patient Instructions (Signed)
Hi Kathlene November  It was good to see you today  I believe you are doing quite well-please keep up the good work with healthy eating and regular physical activity.  Please give consideration to doing Tdap as well as shingles vaccine-Shingrix through your trusted pharmacy. Please follow-up in 1 year with additional lab work  We will send a referral to Dr. Ronne Binning regarding follow-up on the PSA We will also send referral regarding coronary calcium scan if you do not hear from them within 2 weeks time regarding when to do this test please notify us.  Take care-Jamal Haskin Gerda Diss MD

## 2023-06-23 NOTE — Progress Notes (Signed)
   Subjective:    Patient ID: Nathan Reynolds, male    DOB: 1956-09-10, 67 y.o.   MRN: [id number]  HPI The patient comes in today for a wellness visit. Nathan Reynolds comes in today overall doing well trying to eat healthy and walks every day tries to stay physically active.  Denies any major setbacks or problems last year we ordered coronary calcium he never got it done partly because he states no one ever called him.  I see where the referral was put in but I do not see any results from the referral   A review of their health history was completed.  A review of medications was also completed.  Any needed refills; no  Eating habits: eating healthy  Falls/  MVA accidents in past few months: no  Regular exercise: yes, walking and gym  Specialist pt sees on regular basis: no  Preventative health issues were discussed.   Additional concerns: no concerns or issues today   He is up-to-date currently on colonoscopy  Review of Systems     Objective:   Physical Exam  General-in no acute distress Eyes-no discharge Lungs-respiratory rate normal, CTA CV-no murmurs,RRR Extremities skin warm dry no edema Neuro grossly normal Behavior normal, alert Prostate being followed by urology      Assessment & Plan:  1. Hyperlipidemia, unspecified hyperlipidemia type Mild hyperlipidemia check coronary calcium test patient desires not to be on medicine if he can help it - CT CARDIAC SCORING (SELF PAY ONLY)  2. Need for vaccination Pneumococcal, also advised Tdap as well as Shingrix but he defers currently - Pneumococcal conjugate vaccine 20-valent (Prevnar 20)  3. Elevated PSA Patient needs to do follow-up with urology-he was being followed by urology for elevated PSA and got lost to follow-up he would like to be referred more locally. - Ambulatory referral to Urology  Adult wellness-complete.wellness physical was conducted today. Importance of diet and exercise were discussed in detail.   Importance of stress reduction and healthy living were discussed.  In addition to this a discussion regarding safety was also covered.  We also reviewed over immunizations and gave recommendations regarding current immunization needed for age.   In addition to this additional areas were also touched on including: Preventative health exams needed:  Colonoscopy June 2026  Patient was advised yearly wellness exam

## 2023-06-25 ENCOUNTER — Other Ambulatory Visit: Payer: Self-pay

## 2023-06-25 DIAGNOSIS — N401 Enlarged prostate with lower urinary tract symptoms: Secondary | ICD-10-CM

## 2023-06-25 DIAGNOSIS — Z1321 Encounter for screening for nutritional disorder: Secondary | ICD-10-CM

## 2023-06-25 DIAGNOSIS — R972 Elevated prostate specific antigen [PSA]: Secondary | ICD-10-CM

## 2023-07-03 ENCOUNTER — Ambulatory Visit (HOSPITAL_COMMUNITY)
Admission: RE | Admit: 2023-07-03 | Discharge: 2023-07-03 | Disposition: A | Discharge status: Home / Self Care | Payer: Self-pay | Source: Ambulatory Visit | Attending: Family Medicine | Admitting: Family Medicine

## 2023-07-03 DIAGNOSIS — E785 Hyperlipidemia, unspecified: Secondary | ICD-10-CM | POA: Insufficient documentation

## 2023-07-23 ENCOUNTER — Telehealth: Payer: Medicare Other | Admitting: Family Medicine

## 2023-08-18 DIAGNOSIS — K080 Exfoliation of teeth due to systemic causes: Secondary | ICD-10-CM | POA: Diagnosis not present

## 2023-08-21 DIAGNOSIS — K080 Exfoliation of teeth due to systemic causes: Secondary | ICD-10-CM | POA: Diagnosis not present

## 2023-10-10 ENCOUNTER — Ambulatory Visit (INDEPENDENT_AMBULATORY_CARE_PROVIDER_SITE_OTHER): Payer: Medicare Other

## 2023-10-10 DIAGNOSIS — Z0000 Encounter for general adult medical examination without abnormal findings: Secondary | ICD-10-CM

## 2023-10-10 NOTE — Patient Instructions (Signed)
Mr. Daluz , Thank you for taking time to come for your Medicare Wellness Visit. I appreciate your ongoing commitment to your health goals. Please review the following plan we discussed and let me know if I can assist you in the future.   Referrals/Orders/Follow-Ups/Clinician Recommendations: none  This is a list of the screening recommended for you and due dates:  Health Maintenance  Topic Date Due   DTaP/Tdap/Td vaccine (2 - Tdap) 12/01/2005   Zoster (Shingles) Vaccine (1 of 2) Never done   COVID-19 Vaccine (4 - 2023-24 season) 08/03/2023   Medicare Annual Wellness Visit  10/09/2024   Colon Cancer Screening  05/19/2025   Pneumonia Vaccine  Completed   Hepatitis C Screening  Completed   HPV Vaccine  Aged Out   Flu Shot  Discontinued    Advanced directives: (Copy Requested) Please bring a copy of your health care power of attorney and living will to the office to be added to your chart at your convenience.  Next Medicare Annual Wellness Visit scheduled for next year: Yes  insert Preventive Care attachment Insert FALL PREVENTION attachment if needed

## 2023-10-10 NOTE — Progress Notes (Signed)
Subjective:   Nathan Reynolds is a 67 y.o. male who presents for Medicare Annual/Subsequent preventive examination.  Visit Complete: Virtual I connected with  Milinda Pointer on 10/10/23 by a audio enabled telemedicine application and verified that I am speaking with the correct person using two identifiers.  Patient Location: Home  Provider Location: Office/Clinic  I discussed the limitations of evaluation and management by telemedicine. The patient expressed understanding and agreed to proceed.  Vital Signs: Because this visit was a virtual/telehealth visit, some criteria may be missing or patient reported. Any vitals not documented were not able to be obtained and vitals that have been documented are patient reported.    Cardiac Risk Factors include: advanced age (>63men, >44 women);male gender     Objective:    Today's Vitals   There is no height or weight on file to calculate BMI.     10/10/2023    2:31 PM 10/03/2022    8:19 AM 05/19/2020    9:35 AM 02/14/2017    6:46 AM  Advanced Directives  Does Patient Have a Medical Advance Directive? No No No No  Would patient like information on creating a medical advance directive?  No - Patient declined No - Patient declined No - Patient declined    Current Medications (verified) Outpatient Encounter Medications as of 10/10/2023  Medication Sig   Biotin (VB7 MAX) 100 MG/GM POWD Take by mouth.   Cholecalciferol (VITAMIN D3) 5000 units CAPS Take 5,000 Units by mouth daily.   fish oil-omega-3 fatty acids 1000 MG capsule Take 2 g by mouth daily.   ibuprofen (IBU) 600 MG tablet TAKE (1) TABLET BY MOUTH (3) TIMES DAILY.   Multiple Vitamin (MULTIVITAMIN) tablet Take 1 tablet by mouth daily.   No facility-administered encounter medications on file as of 10/10/2023.    Allergies (verified) Patient has no known allergies.   History: Past Medical History:  Diagnosis Date   Hyperglycemia    Past Surgical History:  Procedure  Laterality Date   COLONOSCOPY     COLONOSCOPY N/A 02/14/2017   Procedure: COLONOSCOPY;  Surgeon: Corbin Ade, MD;  [facility];  Service: Endoscopy;  Laterality: N/A;  7:30 AM   COLONOSCOPY N/A 05/19/2020   Procedure: COLONOSCOPY;  Surgeon: Corbin Ade, MD;  [facility];  Service: Endoscopy;  Laterality: N/A;  10:30   POLYPECTOMY  02/14/2017   Procedure: POLYPECTOMY;  Surgeon: Corbin Ade, MD;  [facility];  Service: Endoscopy;;  cecal, ascending, decending   POLYPECTOMY  05/19/2020   Procedure: POLYPECTOMY;  Surgeon: Corbin Ade, MD;  [facility];  Service: Endoscopy;;   Family History  Problem Relation Age of Onset   Diabetes Father    Social History   Socioeconomic History   Marital status: Married    Spouse name: Not on file   Number of children: Not on file   Years of education: Not on file   Highest education level: Not on file  Occupational History   Not on file  Tobacco Use   Smoking status: Never   Smokeless tobacco: Never  Vaping Use   Vaping status: Never Used  Substance and Sexual Activity   Alcohol use: No   Drug use: No   Sexual activity: Not on file  Other Topics Concern   Not on file  Social History Narrative   Not on file   Social Determinants of Health   Financial Resource Strain: Low Risk  (  10/10/2023)   Overall Financial Resource Strain (CARDIA)    Difficulty of Paying Living Expenses: Not hard at all  Food Insecurity: No Food Insecurity (10/10/2023)   Hunger Vital Sign    Worried About Running Out of Food in the Last Year: Never true    Ran Out of Food in the Last Year: Never true  Transportation Needs: No Transportation Needs (10/10/2023)   PRAPARE - Administrator, Civil Service (Medical): No    Lack of Transportation (Non-Medical): No  Physical Activity: Sufficiently Active (10/10/2023)   Exercise Vital Sign    Days of Exercise per Week: 7 days    Minutes of Exercise per  Session: 60 min  Stress: No Stress Concern Present (10/10/2023)   [employer] of Occupational Health - Occupational Stress Questionnaire    Feeling of Stress : Not at all  Social Connections: Moderately Integrated (10/10/2023)   Social Connection and Isolation Panel [NHANES]    Frequency of Communication with Friends and Family: More than three times a week    Frequency of Social Gatherings with Friends and Family: More than three times a week    Attends Religious Services: More than 4 times per year    Active Member of [employer] or Organizations: No    Attends Engineer, structural: Never    Marital Status: Married    Tobacco Counseling Counseling given: Not Answered   Clinical Intake:  Pre-visit preparation completed: Yes  Pain : No/denies pain     Nutritional Risks: None Diabetes: No  How often do you need to have someone help you when you read instructions, pamphlets, or other written materials from your doctor or pharmacy?: 1 - Never  Interpreter Needed?: No  Information entered by :: NAllen LPN   Activities of Daily Living    10/10/2023    2:27 PM  In your present state of health, do you have any difficulty performing the following activities:  Hearing? 0  Vision? 0  Difficulty concentrating or making decisions? 0  Walking or climbing stairs? 0  Dressing or bathing? 0  Doing errands, shopping? 0  Preparing Food and eating ? N  Using the Toilet? N  In the past six months, have you accidently leaked urine? N  Do you have problems with loss of bowel control? N  Managing your Medications? N  Managing your Finances? N  Housekeeping or managing your Housekeeping? N    Patient Care Team: Babs Sciara, MD as PCP - General (Family Medicine)  Indicate any recent Medical Services you may have received from other than [facility] providers in the past year (date may be approximate).     Assessment:   This is a routine wellness examination for  Nathan Reynolds.  Hearing/Vision screen Hearing Screening - Comments:: Denies hearing issues Vision Screening - Comments:: Regular eye exams, [facility]   Goals Addressed             This Visit's Progress    Patient Stated       10/10/2023, keep weight in check       Depression Screen    10/10/2023    2:32 PM 06/23/2023    9:10 AM 10/03/2022    8:18 AM 01/12/2021    9:37 AM 12/22/2017    9:04 AM 11/10/2015    9:31 AM  PHQ 2/9 Scores  PHQ - 2 Score 0 0 0 0 0 0  PHQ- 9 Score 0 0 0       Fall Risk  10/10/2023    2:32 PM 06/23/2023    9:09 AM 10/03/2022    8:20 AM 04/10/2022    1:47 PM 01/12/2021    9:37 AM  Fall Risk   Falls in the past year? 0 0 0 0 0  Number falls in past yr: 0 0 0 0   Injury with Fall? 0 0 0 0   Risk for fall due to : No Fall Risks  No Fall Risks No Fall Risks   Follow up Falls prevention discussed;Falls evaluation completed  Falls prevention discussed;Falls evaluation completed Falls evaluation completed Falls evaluation completed    MEDICARE RISK AT HOME: Medicare Risk at Home Any stairs in or around the home?: No If so, are there any without handrails?: No Home free of loose throw rugs in walkways, pet beds, electrical cords, etc?: Yes Adequate lighting in your home to reduce risk of falls?: Yes Life alert?: No Use of a cane, walker or w/c?: No Grab bars in the bathroom?: Yes Shower chair or bench in shower?: No Elevated toilet seat or a handicapped toilet?: Yes  TIMED UP AND GO:  Was the test performed?  No    Cognitive Function:        10/10/2023    2:33 PM 10/03/2022    8:20 AM  6CIT Screen  What Year? 0 points 0 points  What month? 0 points 0 points  What time? 0 points 0 points  Count back from 20 0 points 0 points  Months in reverse 0 points 0 points  Repeat phrase 0 points 0 points  Total Score 0 points 0 points    Immunizations Immunization History  Administered Date(s) Administered   PFIZER(Purple Top)SARS-COV-2 Vaccination  02/06/2020, 02/27/2020, 11/23/2020   PNEUMOCOCCAL CONJUGATE-20 06/23/2023   Td 12/02/1995    TDAP status: Due, Education has been provided regarding the importance of this vaccine. Advised may receive this vaccine at local pharmacy or Health Dept. Aware to provide a copy of the vaccination record if obtained from local pharmacy or Health Dept. Verbalized acceptance and understanding.  Flu Vaccine status: Declined, Education has been provided regarding the importance of this vaccine but patient still declined. Advised may receive this vaccine at local pharmacy or Health Dept. Aware to provide a copy of the vaccination record if obtained from local pharmacy or Health Dept. Verbalized acceptance and understanding.  Pneumococcal vaccine status: Up to date  Covid-19 vaccine status: Information provided on how to obtain vaccines.   Qualifies for Shingles Vaccine? Yes   Zostavax completed No   Shingrix Completed?: No.    Education has been provided regarding the importance of this vaccine. Patient has been advised to call insurance company to determine out of pocket expense if they have not yet received this vaccine. Advised may also receive vaccine at local pharmacy or Health Dept. Verbalized acceptance and understanding.  Screening Tests Health Maintenance  Topic Date Due   DTaP/Tdap/Td (2 - Tdap) 12/01/2005   Zoster Vaccines- Shingrix (1 of 2) Never done   COVID-19 Vaccine (4 - 2023-24 season) 08/03/2023   Medicare Annual Wellness (AWV)  10/09/2024   Colonoscopy  05/19/2025   Pneumonia Vaccine 45+ Years old  Completed   Hepatitis C Screening  Completed   HPV VACCINES  Aged Out   INFLUENZA VACCINE  Discontinued    Health Maintenance  Health Maintenance Due  Topic Date Due   DTaP/Tdap/Td (2 - Tdap) 12/01/2005   Zoster Vaccines- Shingrix (1 of 2) Never done   COVID-19  Vaccine (4 - 2023-24 season) 08/03/2023    Colorectal cancer screening: Type of screening: Colonoscopy. Completed  05/19/2020. Repeat every 5 years  Lung Cancer Screening: (Low Dose CT Chest recommended if Age 35-80 years, 20 pack-year currently smoking OR have quit w/in 15years.) does not qualify.   Lung Cancer Screening Referral: no  Additional Screening:  Hepatitis C Screening: does qualify; Completed 12/19/2017  Vision Screening: Recommended annual ophthalmology exams for early detection of glaucoma and other disorders of the eye. Is the patient up to date with their annual eye exam?  Yes  Who is the provider or what is the name of the office in which the patient attends annual eye exams? [facility] If pt is not established with a provider, would they like to be referred to a provider to establish care? No .   Dental Screening: Recommended annual dental exams for proper oral hygiene  Diabetic Foot Exam: n/a  Community Resource Referral / Chronic Care Management: CRR required this visit?  No   CCM required this visit?  No     Plan:     I have personally reviewed and noted the following in the patient's chart:   Medical and social history Use of alcohol, tobacco or illicit drugs  Current medications and supplements including opioid prescriptions. Patient is not currently taking opioid prescriptions. Functional ability and status Nutritional status Physical activity Advanced directives List of other physicians Hospitalizations, surgeries, and ER visits in previous 12 months Vitals Screenings to include cognitive, depression, and falls Referrals and appointments  In addition, I have reviewed and discussed with patient certain preventive protocols, quality metrics, and best practice recommendations. A written personalized care plan for preventive services as well as general preventive health recommendations were provided to patient.     Barb Merino, LPN   19/12/4780   After Visit Summary: (MyChart) Due to this being a telephonic visit, the after visit summary with patients  personalized plan was offered to patient via MyChart   Nurse Notes: none

## 2023-12-07 ENCOUNTER — Ambulatory Visit

## 2023-12-10 ENCOUNTER — Other Ambulatory Visit: Payer: Self-pay | Admitting: Family Medicine

## 2024-01-13 DIAGNOSIS — H524 Presbyopia: Secondary | ICD-10-CM | POA: Diagnosis not present

## 2024-08-24 DIAGNOSIS — K080 Exfoliation of teeth due to systemic causes: Secondary | ICD-10-CM | POA: Diagnosis not present

## 2024-09-24 ENCOUNTER — Ambulatory Visit: Admitting: Orthopedic Surgery

## 2024-09-24 ENCOUNTER — Other Ambulatory Visit: Payer: Self-pay

## 2024-09-24 ENCOUNTER — Other Ambulatory Visit (INDEPENDENT_AMBULATORY_CARE_PROVIDER_SITE_OTHER): Payer: Self-pay

## 2024-09-24 ENCOUNTER — Encounter: Payer: Self-pay | Admitting: Orthopedic Surgery

## 2024-09-24 ENCOUNTER — Ambulatory Visit (HOSPITAL_BASED_OUTPATIENT_CLINIC_OR_DEPARTMENT_OTHER): Admitting: Orthopaedic Surgery

## 2024-09-24 VITALS — BP 142/94 | HR 72 | Ht 68.0 in | Wt 190.0 lb

## 2024-09-24 DIAGNOSIS — M19011 Primary osteoarthritis, right shoulder: Secondary | ICD-10-CM | POA: Diagnosis not present

## 2024-09-24 DIAGNOSIS — G8929 Other chronic pain: Secondary | ICD-10-CM

## 2024-09-24 DIAGNOSIS — M25511 Pain in right shoulder: Secondary | ICD-10-CM | POA: Diagnosis not present

## 2024-09-24 DIAGNOSIS — M25512 Pain in left shoulder: Secondary | ICD-10-CM

## 2024-09-24 DIAGNOSIS — M19012 Primary osteoarthritis, left shoulder: Secondary | ICD-10-CM | POA: Diagnosis not present

## 2024-09-24 MED ORDER — IBUPROFEN 600 MG PO TABS: ORAL_TABLET | ORAL | 0 refills | Status: AC

## 2024-09-24 NOTE — Progress Notes (Signed)
  Intake history:  Chief Complaint  Patient presents with   Shoulder Pain    Both      BP (!) 142/94   Pulse 72   Ht 5' 8 (1.727 m)   Wt 190 lb (86.2 kg)   BMI 28.89 kg/m  Body mass index is 28.89 kg/m.  Pharmacy? ______________________________________  WHAT ARE WE SEEING YOU FOR TODAY?   Both shoulder s  How long has this bothered you? (DOI?DOS?WS?)  Since March/ April cutting splitting wood  Was there an injury? No  Anticoag.  No  Diabetes No  Heart disease No  Hypertension No  SMOKING HX No  Kidney disease No  Any ALLERGIES ______[facility] ________________________________________   Treatment:  Have you taken:  Tylenol No  Advil  Yes  Had PT No  Had injection No  Other  _________________________

## 2024-09-24 NOTE — Patient Instructions (Addendum)
 We did not find any evidence of rotator cuff tear  You do seem to have some rotator cuff irritation which is referred to in the medical literature as impingement or rotator cuff syndrome  Recommend ibuprofen , heating pad as needed for 30 minutes  Gentle range of motion exercises and strengthening exercises   X-rays did show there may be some evidence of longstanding rotator cuff insufficiency or weakness The humeral head is slightly out of position  This can progress to a full-thickness rotator cuff tear over time depending on your activity level and stress put on the shoulder  As far as exercises go do not do behind the neck presses or standard free weight bench pressing

## 2024-09-24 NOTE — Progress Notes (Signed)
 Office Visit Note   Patient: Nathan Reynolds           Date of Birth: 1956/04/27           MRN: [id number] Visit Date: 09/24/2024 Requested by: Alphonsa Glendia DELENA, MD [address] [place],  [place] [address] PCP: Alphonsa Glendia DELENA, MD   Assessment & Plan:   Encounter Diagnoses  Name Primary?   Acute pain of left shoulder Yes   Acute pain of right shoulder     Meds ordered this encounter  Medications   ibuprofen  (IBU) 600 MG tablet    Sig: TAKE (1) TABLET BY MOUTH (3) TIMES DAILY.  Use as needed for joint discomfort    Dispense:  90 tablet    Refill:  45    68 year old male with acute onset of shoulder pain after carrying and lifting and chopping wood.  X-rays show rotator cuff insufficiency type changes with proximal humeral head migration  However his clinical exam suggest no significant or severe disease  Recommend home exercise program, anti-inflammatories and activity modification  Disease process and progression explained to the patient   Subjective: Chief Complaint  Patient presents with   Shoulder Pain    Both     HPI: 68 year old male relatively healthy presents with bilateral shoulder pain after chopping and splitting and carrying wood from a tree that was cut down  No major pain but intermittent catching [name] painful sensation when elevating the arm              ROS: Denies neck pain   Images personally read and my interpretation :  DG Shoulder Left Result Date: 09/24/2024 X-ray report Chief complaint left shoulder pain Images AP and lateral Reading: AC joint shows joint space narrowing hypertrophy with osteophytes, the glenohumeral joint is normal in terms of space with humeral head proximal migration sclerosis of the greater tuberosity and undersurface of the acromion 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  DG Shoulder Right Result Date: 09/24/2024 X-ray report Chief complaint right shoulder pain  Images AP lateral right shoulder Reading: Glenohumeral joint looks normal humeral head height looks a little decreased with some impingement on the humeral head acromial distance and some spurring on the lateral edge of the acromion but no osteophytes or narrowing there is also narrowing of the acromioclavicular joint with some cyst and sclerosis there Impression: Arthritis AC joint mild humeral head subacromial distance decreased and humeral head proximal migration may suggest chronic rotator cuff problem    Visit Diagnoses:  1. Acute pain of left shoulder   2. Acute pain of right shoulder      Follow-Up Instructions: Return if symptoms worsen or fail to improve.    Objective: Vital Signs: BP (!) 142/94   Pulse 72   Ht 5' 8 (1.727 m)   Wt 190 lb (86.2 kg)   BMI 28.89 kg/m   Physical Exam Vitals and nursing note reviewed.  Constitutional:      Appearance: Normal appearance.  HENT:     Head: Normocephalic and atraumatic.  Eyes:     General: No scleral icterus.       Right eye: No discharge.        Left eye: No discharge.     Extraocular Movements: Extraocular movements intact.     Conjunctiva/sclera: Conjunctivae normal.     Pupils: Pupils are equal, round, and reactive to light.  Cardiovascular:     Rate and Rhythm: Normal  rate.     Pulses: Normal pulses.  Skin:    General: Skin is warm and dry.     Capillary Refill: Capillary refill takes less than 2 seconds.  Neurological:     General: No focal deficit present.     Mental Status: He is alert and oriented to person, place, and time.  Psychiatric:        Mood and Affect: Mood normal.        Behavior: Behavior normal.        Thought Content: Thought content normal.        Judgment: Judgment normal.      Right Shoulder Exam  Right shoulder exam is normal.  Tenderness  The patient is experiencing no tenderness.  Range of Motion  The patient has normal right shoulder ROM.  Muscle Strength  The patient has  normal right shoulder strength.  Tests  Apprehension: negative  Other  Erythema: absent Sensation: normal Pulse: present   Left Shoulder Exam  Left shoulder exam is normal.  Tenderness  The patient is experiencing no tenderness.   Range of Motion  The patient has normal left shoulder ROM.  Muscle Strength  The patient has normal left shoulder strength.  Tests  Apprehension: negative  Other  Erythema: absent Sensation: normal Pulse: present        Specialty Comments:  No specialty comments available.  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  DG Shoulder Right Result Date: 09/24/2024 X-ray report Chief complaint right shoulder pain Images AP lateral right shoulder Reading: Glenohumeral joint looks normal humeral head height looks a little decreased with some impingement on the humeral head acromial distance and some spurring on the lateral edge of the acromion but no osteophytes or narrowing there is also narrowing of the acromioclavicular joint with some cyst and sclerosis there Impression: Arthritis AC joint mild humeral head subacromial distance decreased and humeral head proximal migration may suggest chronic rotator cuff problem    PMFS History: Patient Active Problem List   Diagnosis Date Noted   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   Elevated PSA 04/24/2022   Past Medical History:  Diagnosis Date   Hyperglycemia     Family History  Problem Relation Age of Onset   Diabetes Father     Past Surgical History:  Procedure Laterality Date    COLONOSCOPY     COLONOSCOPY N/A 02/14/2017   Procedure: COLONOSCOPY;  Surgeon: Lamar CHRISTELLA Hollingshead, MD;  [facility];  Service: Endoscopy;  Laterality: N/A;  7:30 AM   COLONOSCOPY N/A 05/19/2020   Procedure: COLONOSCOPY;  Surgeon: Hollingshead Lamar CHRISTELLA, MD;  [facility];  Service: Endoscopy;  Laterality: N/A;  10:30   POLYPECTOMY  02/14/2017   Procedure: POLYPECTOMY;  Surgeon: Lamar CHRISTELLA Hollingshead, MD;  [facility];  Service: Endoscopy;;  cecal, ascending, decending   POLYPECTOMY  05/19/2020   Procedure: POLYPECTOMY;  Surgeon: Hollingshead Lamar CHRISTELLA, MD;  [facility];  Service: Endoscopy;;   Social History   Occupational History   Not on file  Tobacco Use   Smoking status: Never   Smokeless tobacco: Never  Vaping Use  Vaping status: Never Used  Substance and Sexual Activity   Alcohol use: No   Drug use: No   Sexual activity: Not on file

## 2024-10-04 ENCOUNTER — Encounter: Payer: Self-pay | Admitting: Radiology

## 2024-10-06 ENCOUNTER — Ambulatory Visit: Admitting: Family Medicine

## 2024-10-06 VITALS — BP 121/76 | Ht 68.0 in | Wt 188.0 lb

## 2024-10-06 DIAGNOSIS — Z0000 Encounter for general adult medical examination without abnormal findings: Secondary | ICD-10-CM

## 2024-10-06 NOTE — Patient Instructions (Signed)
 [name],  Thank you for taking the time for your Medicare Wellness Visit. I appreciate your continued commitment to your health goals. Please review the care plan we discussed, and feel free to reach out if I can assist you further.  Please note that Annual Wellness Visits do not include a physical exam. Some assessments may be limited, especially if the visit was conducted virtually. If needed, we may recommend an in-person follow-up with your provider.  Ongoing Care Seeing your primary care provider every 3 to 6 months helps us  monitor your health and provide consistent, personalized care.   Referrals If a referral was made during today's visit and you haven't received any updates within two weeks, please contact the referred provider directly to check on the status.  Recommended Screenings:  Health Maintenance  Topic Date Due   DTaP/Tdap/Td vaccine (2 - Tdap) 12/01/2005   Zoster (Shingles) Vaccine (1 of 2) Never done   Flu Shot  Never done   COVID-19 Vaccine (4 - 2025-26 season) 08/02/2024   Colon Cancer Screening  05/19/2025   Medicare Annual Wellness Visit  10/06/2025   Pneumococcal Vaccine for age over 19  Completed   Hepatitis C Screening  Completed   Meningitis B Vaccine  Aged Out       10/06/2024    5:21 PM  Advanced Directives  Does Patient Have a Medical Advance Directive? No  Would patient like information on creating a medical advance directive? No - Patient declined    Vision: Annual vision screenings are recommended for early detection of glaucoma, cataracts, and diabetic retinopathy. These exams can also reveal signs of chronic conditions such as diabetes and high blood pressure.  Dental: Annual dental screenings help detect early signs of oral cancer, gum disease, and other conditions linked to overall health, including heart disease and diabetes.  Please see the attached documents for additional preventive care recommendations.

## 2024-10-06 NOTE — Progress Notes (Cosign Needed Addendum)
 I connected with  Nathan Reynolds on 10/06/24 by a audio enabled telemedicine application and verified that I am speaking with the correct person using two identifiers.  Patient Location: Home  Provider Location: Office/Clinic  I discussed the limitations of evaluation and management by telemedicine. The patient expressed understanding and agreed to proceed.  Subjective:   Nathan Reynolds is a 68 y.o. male who presents for a Medicare Annual Wellness Visit.  Allergies (verified) Patient has no known allergies.   History: Past Medical History:  Diagnosis Date   Hyperglycemia    Past Surgical History:  Procedure Laterality Date   COLONOSCOPY     COLONOSCOPY N/A 02/14/2017   Procedure: COLONOSCOPY;  Surgeon: Lamar CHRISTELLA Hollingshead, MD;  Location: [facility];  Service: Endoscopy;  Laterality: N/A;  7:30 AM   COLONOSCOPY N/A 05/19/2020   Procedure: COLONOSCOPY;  Surgeon: Hollingshead Lamar CHRISTELLA, MD;  Location: [facility];  Service: Endoscopy;  Laterality: N/A;  10:30   POLYPECTOMY  02/14/2017   Procedure: POLYPECTOMY;  Surgeon: Lamar CHRISTELLA Hollingshead, MD;  Location: [facility];  Service: Endoscopy;;  cecal, ascending, decending   POLYPECTOMY  05/19/2020   Procedure: POLYPECTOMY;  Surgeon: Hollingshead Lamar CHRISTELLA, MD;  Location: [facility];  Service: Endoscopy;;   Family History  Problem Relation Age of Onset   Diabetes Father    Social History   Occupational History   Not on file  Tobacco Use   Smoking status: Never   Smokeless tobacco: Never  Vaping Use   Vaping status: Never Used  Substance and Sexual Activity   Alcohol use: No   Drug use: No   Sexual activity: Not on file   Tobacco Counseling Counseling given: Yes  SDOH Screenings   Food Insecurity: No Food Insecurity (10/06/2024)  Housing: Low Risk  (10/06/2024)  Transportation Needs: No Transportation Needs (10/06/2024)  Utilities: Not At Risk (10/06/2024)  Alcohol Screen: Low Risk  (10/10/2023)  Depression (PHQ2-9): Low Risk   (10/06/2024)  Financial Resource Strain: Low Risk  (10/10/2023)  Physical Activity: Sufficiently Active (10/06/2024)  Social Connections: Moderately Integrated (10/06/2024)  Stress: No Stress Concern Present (10/06/2024)  Tobacco Use: Low Risk  (10/06/2024)  Health Literacy: Adequate Health Literacy (10/06/2024)   Depression Screen    10/06/2024    5:23 PM 10/10/2023    2:32 PM 06/23/2023    9:10 AM 10/03/2022    8:18 AM 01/12/2021    9:37 AM 12/22/2017    9:04 AM 11/10/2015    9:31 AM  PHQ 2/9 Scores  PHQ - 2 Score 0 0 0 0 0 0 0  PHQ- 9 Score  0 0 0        Goals Addressed               This Visit's Progress     Remain active and healthy (pt-stated)         Visit info / Clinical Intake: Medicare Wellness Visit Type:: Subsequent Annual Wellness Visit Medicare Wellness Visit Mode:: Telephone If telephone:: video error If telephone or video:: pt reported vitals Interpreter Needed?: No Pre-visit prep was completed: yes AWV questionnaire completed by patient prior to visit?: no Living arrangements:: lives with spouse/significant other Typical amount of pain: none Does pain affect daily life?: no Are you currently prescribed opioids?: no  Dietary Habits and Nutritional Risks How many meals a day?: 3 Eats fruit and vegetables daily?: yes Most meals are obtained by: preparing own meals Diabetic:: no  Functional Status Activities of  Daily Living (to include ambulation/medication): Independent Ambulation: Independent Medication Administration: Independent Home Management: Independent Manage your own finances?: yes Primary transportation is: driving Concerns about vision?: no *vision screening is required for WTM* Concerns about hearing?: no  Fall Screening Falls in the past year?: 0 Number of falls in past year: 0 Was there an injury with Fall?: 0 Fall Risk Category Calculator: 0 Patient Fall Risk Level: Low Fall Risk  Fall Risk Patient at Risk for Falls Due to: No Fall  Risks Fall risk Follow up: Falls prevention discussed; Education provided; Falls evaluation completed  Home and Transportation Safety: All rugs have non-skid backing?: N/A, no rugs All stairs or steps have railings?: N/A, no stairs Grab bars in the bathtub or shower?: yes Have non-skid surface in bathtub or shower?: yes Good home lighting?: yes Regular seat belt use?: yes Hospital stays in the last year:: no  Cognitive Assessment Difficulty concentrating, remembering, or making decisions? : no Will 6CIT or Mini Cog be Completed: no 6CIT or Mini Cog Declined: patient alert, oriented, able to answer questions appropriately and recall recent events What year is it?: 0 points What month is it?: 0 points Give patient an address phrase to remember (5 components): [address] [place] About what time is it?: 0 points Count backwards from 20 to 1: 0 points Say the months of the year in reverse: 0 points Repeat the address phrase from earlier: 0 points 6 CIT Score: 0 points  Advance Directives (For Healthcare) Does Patient Have a Medical Advance Directive?: No Would patient like information on creating a medical advance directive?: No - Patient declined  Reviewed/Updated  Reviewed/Updated: All        Objective:    Today's Vitals   10/06/24 1719  BP: 121/76  Weight: 188 lb (85.3 kg)  Height: 5' 8 (1.727 m)   Body mass index is 28.59 kg/m.  Current Medications (verified) Outpatient Encounter Medications as of 10/06/2024  Medication Sig   Biotin (VB7 MAX) 100 MG/GM POWD Take by mouth.   Cholecalciferol (VITAMIN D3) 5000 units CAPS Take 5,000 Units by mouth daily.   fish oil-omega-3 fatty acids 1000 MG capsule Take 2 g by mouth daily.   ibuprofen  (IBU) 600 MG tablet TAKE (1) TABLET BY MOUTH (3) TIMES DAILY.  Use as needed for joint discomfort   Multiple Vitamin (MULTIVITAMIN) tablet Take 1 tablet by mouth daily.   No facility-administered encounter medications on file as  of 10/06/2024.   Hearing/Vision screen Hearing Screening - Comments:: Patient denies any hearing difficulties.   Vision Screening - Comments:: Wears rx glasses - up to date with routine eye exams with  Oneil Kawasaki Immunizations and Health Maintenance Health Maintenance  Topic Date Due   DTaP/Tdap/Td (2 - Tdap) 12/01/2005   Zoster Vaccines- Shingrix (1 of 2) Never done   Influenza Vaccine  Never done   COVID-19 Vaccine (4 - 2025-26 season) 08/02/2024   Medicare Annual Wellness (AWV)  10/09/2024   Colonoscopy  05/19/2025   Pneumococcal Vaccine: 50+ Years  Completed   Hepatitis C Screening  Completed   Meningococcal B Vaccine  Aged Out        Assessment/Plan:  This is a routine wellness examination for Anthonyjames.  Patient Care Team: Alphonsa Glendia LABOR, MD as PCP - General (Family Medicine)  I have personally reviewed and noted the following in the patient's chart:   Medical and social history Use of alcohol, tobacco or illicit drugs  Current medications and supplements including opioid  prescriptions. Functional ability and status Nutritional status Physical activity Advanced directives List of other physicians Hospitalizations, surgeries, and ER visits in previous 12 months Vitals Screenings to include cognitive, depression, and falls Referrals and appointments  No orders of the defined types were placed in this encounter.  In addition, I have reviewed and discussed with patient certain preventive protocols, quality metrics, and best practice recommendations. A written personalized care plan for preventive services as well as general preventive health recommendations were provided to patient.   Elowen Debruyn, CMA   10/06/2024   No follow-ups on file.  After Visit Summary: (MyChart) Due to this being a telephonic visit, the after visit summary with patients personalized plan was offered to patient via MyChart   Nurse Notes: nothing to report

## 2024-10-08 ENCOUNTER — Other Ambulatory Visit: Payer: Self-pay | Admitting: Family Medicine

## 2024-10-08 ENCOUNTER — Encounter: Payer: Self-pay | Admitting: Family Medicine

## 2024-10-08 ENCOUNTER — Ambulatory Visit

## 2024-10-08 ENCOUNTER — Telehealth: Payer: Self-pay | Admitting: *Deleted

## 2024-10-08 DIAGNOSIS — E559 Vitamin D deficiency, unspecified: Secondary | ICD-10-CM

## 2024-10-08 DIAGNOSIS — E785 Hyperlipidemia, unspecified: Secondary | ICD-10-CM

## 2024-10-08 DIAGNOSIS — Z125 Encounter for screening for malignant neoplasm of prostate: Secondary | ICD-10-CM

## 2024-10-08 DIAGNOSIS — N138 Other obstructive and reflux uropathy: Secondary | ICD-10-CM

## 2024-10-08 DIAGNOSIS — D72819 Decreased white blood cell count, unspecified: Secondary | ICD-10-CM

## 2024-10-08 DIAGNOSIS — Z0000 Encounter for general adult medical examination without abnormal findings: Secondary | ICD-10-CM

## 2024-10-08 NOTE — Telephone Encounter (Signed)
 Copied from CRM #[id number]. Topic: Clinical - Request for Lab/Test Order >> Oct 08, 2024  1:30 PM Lauren C wrote: Reason for CRM: Pt is calling to see if labs for his physical are in his chart. I let him know only psa and vit d are active. He wants to do all general panels that are normally ordered at his physical as well. Please call pt at [phone]

## 2024-10-08 NOTE — Telephone Encounter (Signed)
 Labs were ordered, MyChart message sent to the patient

## 2024-10-11 DIAGNOSIS — E785 Hyperlipidemia, unspecified: Secondary | ICD-10-CM | POA: Diagnosis not present

## 2024-10-11 DIAGNOSIS — N401 Enlarged prostate with lower urinary tract symptoms: Secondary | ICD-10-CM | POA: Diagnosis not present

## 2024-10-11 DIAGNOSIS — E559 Vitamin D deficiency, unspecified: Secondary | ICD-10-CM | POA: Diagnosis not present

## 2024-10-11 DIAGNOSIS — D72819 Decreased white blood cell count, unspecified: Secondary | ICD-10-CM | POA: Diagnosis not present

## 2024-10-11 DIAGNOSIS — Z0000 Encounter for general adult medical examination without abnormal findings: Secondary | ICD-10-CM | POA: Diagnosis not present

## 2024-10-11 DIAGNOSIS — Z125 Encounter for screening for malignant neoplasm of prostate: Secondary | ICD-10-CM | POA: Diagnosis not present

## 2024-10-11 DIAGNOSIS — N138 Other obstructive and reflux uropathy: Secondary | ICD-10-CM | POA: Diagnosis not present

## 2024-10-12 ENCOUNTER — Ambulatory Visit: Payer: Self-pay | Admitting: Family Medicine

## 2024-10-12 LAB — CBC WITH DIFFERENTIAL/PLATELET
Basophils Absolute: 0.1 x10E3/uL (ref 0.0–0.2)
Basos: 1 %
EOS (ABSOLUTE): 0.2 x10E3/uL (ref 0.0–0.4)
Eos: 4 %
Hematocrit: 45.9 % (ref 37.5–51.0)
Hemoglobin: 15.1 g/dL (ref 13.0–17.7)
Immature Grans (Abs): 0 x10E3/uL (ref 0.0–0.1)
Immature Granulocytes: 0 %
Lymphocytes Absolute: 1.6 x10E3/uL (ref 0.7–3.1)
Lymphs: 37 %
MCH: 31.1 pg (ref 26.6–33.0)
MCHC: 32.9 g/dL (ref 31.5–35.7)
MCV: 94 fL (ref 79–97)
Monocytes Absolute: 0.4 x10E3/uL (ref 0.1–0.9)
Monocytes: 10 %
Neutrophils Absolute: 2 x10E3/uL (ref 1.4–7.0)
Neutrophils: 48 %
Platelets: 173 x10E3/uL (ref 150–450)
RBC: 4.86 x10E6/uL (ref 4.14–5.80)
RDW: 11.9 % (ref 11.6–15.4)
WBC: 4.2 x10E3/uL (ref 3.4–10.8)

## 2024-10-12 LAB — CMP14+EGFR
ALT: 21 IU/L (ref 0–44)
AST: 19 IU/L (ref 0–40)
Albumin: 4.8 g/dL (ref 3.9–4.9)
Alkaline Phosphatase: 93 IU/L (ref 47–123)
BUN/Creatinine Ratio: 17 (ref 10–24)
BUN: 16 mg/dL (ref 8–27)
Bilirubin Total: 0.5 mg/dL (ref 0.0–1.2)
CO2: 22 mmol/L (ref 20–29)
Calcium: 9.5 mg/dL (ref 8.6–10.2)
Chloride: 100 mmol/L (ref 96–106)
Creatinine, Ser: 0.96 mg/dL (ref 0.76–1.27)
Globulin, Total: 2.6 g/dL (ref 1.5–4.5)
Glucose: 96 mg/dL (ref 70–99)
Potassium: 4.9 mmol/L (ref 3.5–5.2)
Sodium: 137 mmol/L (ref 134–144)
Total Protein: 7.4 g/dL (ref 6.0–8.5)
eGFR: 86 mL/min/1.73 (ref >59)

## 2024-10-12 LAB — LIPID PANEL
Chol/HDL Ratio: 3.3 ratio (ref 0.0–5.0)
Cholesterol, Total: 166 mg/dL (ref 100–199)
HDL: 50 mg/dL (ref >39)
LDL Chol Calc (NIH): 102 mg/dL — ABNORMAL HIGH (ref 0–99)
Triglycerides: 73 mg/dL (ref 0–149)
VLDL Cholesterol Cal: 14 mg/dL (ref 5–40)

## 2024-10-12 LAB — VITAMIN D 25 HYDROXY (VIT D DEFICIENCY, FRACTURES): Vit D, 25-Hydroxy: 83.7 ng/mL (ref 30.0–100.0)

## 2024-10-12 LAB — PSA: Prostate Specific Ag, Serum: 4 ng/mL (ref 0.0–4.0)

## 2024-10-13 ENCOUNTER — Encounter: Payer: Self-pay | Admitting: Family Medicine

## 2024-10-13 ENCOUNTER — Ambulatory Visit: Admitting: Family Medicine

## 2024-10-13 VITALS — BP 116/70 | HR 77 | Temp 97.7°F | Ht 68.0 in | Wt 188.5 lb

## 2024-10-13 DIAGNOSIS — Z0001 Encounter for general adult medical examination with abnormal findings: Secondary | ICD-10-CM

## 2024-10-13 DIAGNOSIS — N400 Enlarged prostate without lower urinary tract symptoms: Secondary | ICD-10-CM | POA: Diagnosis not present

## 2024-10-13 DIAGNOSIS — E785 Hyperlipidemia, unspecified: Secondary | ICD-10-CM | POA: Diagnosis not present

## 2024-10-13 DIAGNOSIS — Z0000 Encounter for general adult medical examination without abnormal findings: Secondary | ICD-10-CM

## 2024-10-13 NOTE — Progress Notes (Signed)
   Subjective:    Patient ID: Nathan Reynolds, male    DOB: 19-Jul-1956, 68 y.o.   MRN: [id number]  HPI Annual Physical  The patient comes in today for a wellness visit.    A review of their health history was completed.  A review of medications was also completed.  Any needed refills; no  Eating habits: Tries to eat healthy on a regular basis  Falls/  MVA accidents in past few months: No accidents or injuries  Regular exercise: Stays physically active goes to the gym does a lot of walking and also does manual labor out in the yard  Specialist pt sees on regular basis: Has seen orthopedist in the past urologist in the past none currently  Preventative health issues were discussed.   Additional concerns: No concerns  Patient states moods are doing well Sleeps well Tries to be safe from what he does Has been made aware of tetanus shot as well as flu vaccine and Shingrix-patient more than likely will get DTaP through pharmacy but will think about flu and shingles vaccines Lab work was reviewed with patient       Review of Systems     Objective:   Physical Exam General-in no acute distress Eyes-no discharge Lungs-respiratory rate normal, CTA CV-no murmurs,RRR Extremities skin warm dry no edema Neuro grossly normal Behavior normal, alert Prostate exam enlarged soft and some areas more firm than others       Assessment & Plan:  Adult wellness-complete.wellness physical was conducted today. Importance of diet and exercise were discussed in detail.  Importance of stress reduction and healthy living were discussed.  In addition to this a discussion regarding safety was also covered.  We also reviewed over immunizations and gave recommendations regarding current immunization needed for age.   In addition to this additional areas were also touched on including: Preventative health exams needed:  Colonoscopy June 2026  Patient was advised yearly wellness  exam  His PSA is 4.0 he does have BPH, he saw urology couple years ago which basically told him to follow his PSAs, I would recommend rechecking this again in 6 months time if he has a significant increase over the next 1 to 2 years I would recommend another consultation with urology  Patient is aware of flu vaccine and shingles vaccine recommendation Patient is aware to get colonoscopy June 20 26 Previous coronary calcium negative LDL slightly elevated patient desires to approach this through healthy eating and regular physical activity

## 2024-10-13 NOTE — Patient Instructions (Signed)
 Repeat PSA in 6 months Colonoscopy in June 2026

## 2025-02-04 ENCOUNTER — Ambulatory Visit

## 2025-10-14 ENCOUNTER — Ambulatory Visit: Admitting: Family Medicine

## 2025-10-14 ENCOUNTER — Ambulatory Visit
